# Patient Record
Sex: Male | Born: 1965
Health system: Southern US, Community
[De-identification: ages and names within clinical notes are randomized; demographics above are authoritative.]

## PROBLEM LIST (undated history)

## (undated) DIAGNOSIS — I10 Essential (primary) hypertension: Secondary | ICD-10-CM

## (undated) DIAGNOSIS — I4892 Unspecified atrial flutter: Secondary | ICD-10-CM

## (undated) DIAGNOSIS — Z789 Other specified health status: Secondary | ICD-10-CM

## (undated) DIAGNOSIS — I509 Heart failure, unspecified: Secondary | ICD-10-CM

## (undated) DIAGNOSIS — K2900 Acute gastritis without bleeding: Secondary | ICD-10-CM

## (undated) DIAGNOSIS — Z87828 Personal history of other (healed) physical injury and trauma: Secondary | ICD-10-CM

## (undated) HISTORY — PX: OTHER SURGICAL HISTORY: SHX169

## (undated) HISTORY — DX: Heart failure, unspecified: I50.9

---
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
## 2009-08-31 ENCOUNTER — Emergency Department (HOSPITAL_COMMUNITY): Admission: AC | Admit: 2009-08-31 | Discharge: 2009-09-01 | Payer: Self-pay

## 2010-09-25 LAB — TYPE AND SCREEN

## 2010-09-25 LAB — COMPREHENSIVE METABOLIC PANEL
AST: 28 U/L (ref 0–37)
Albumin: 4 g/dL (ref 3.5–5.2)
CO2: 24 mEq/L (ref 19–32)
Calcium: 9.2 mg/dL (ref 8.4–10.5)
Glucose, Bld: 104 mg/dL — ABNORMAL HIGH (ref 70–99)
Sodium: 140 mEq/L (ref 135–145)
Total Bilirubin: 0.5 mg/dL (ref 0.3–1.2)
Total Protein: 7.1 g/dL (ref 6.0–8.3)

## 2010-09-25 LAB — CBC
HCT: 46.4 % (ref 39.0–52.0)
Hemoglobin: 16.3 g/dL (ref 13.0–17.0)

## 2010-09-25 LAB — POCT I-STAT, CHEM 8
BUN: 5 mg/dL — ABNORMAL LOW (ref 6–23)
Calcium, Ion: 1.15 mmol/L (ref 1.12–1.32)
Glucose, Bld: 103 mg/dL — ABNORMAL HIGH (ref 70–99)
Hemoglobin: 16.3 g/dL (ref 13.0–17.0)
TCO2: 24 mmol/L (ref 0–100)

## 2010-09-25 LAB — PROTIME-INR
INR: 0.9 (ref 0.00–1.49)
Prothrombin Time: 12.1 seconds (ref 11.6–15.2)

## 2012-09-28 ENCOUNTER — Emergency Department (HOSPITAL_COMMUNITY)
Admission: EM | Admit: 2012-09-28 | Discharge: 2012-09-28 | Disposition: A | Discharge status: Home / Self Care | Payer: BC Managed Care – PPO | Attending: Emergency Medicine | Admitting: Emergency Medicine

## 2012-09-28 ENCOUNTER — Encounter (HOSPITAL_COMMUNITY): Payer: Self-pay | Admitting: *Deleted

## 2012-09-28 ENCOUNTER — Emergency Department (HOSPITAL_COMMUNITY): Payer: BC Managed Care – PPO

## 2012-09-28 DIAGNOSIS — F17200 Nicotine dependence, unspecified, uncomplicated: Secondary | ICD-10-CM | POA: Insufficient documentation

## 2012-09-28 DIAGNOSIS — R10819 Abdominal tenderness, unspecified site: Secondary | ICD-10-CM | POA: Insufficient documentation

## 2012-09-28 DIAGNOSIS — K2900 Acute gastritis without bleeding: Secondary | ICD-10-CM

## 2012-09-28 DIAGNOSIS — R110 Nausea: Secondary | ICD-10-CM | POA: Insufficient documentation

## 2012-09-28 LAB — COMPREHENSIVE METABOLIC PANEL
ALT: 26 U/L (ref 0–53)
AST: 20 U/L (ref 0–37)
Calcium: 10.3 mg/dL (ref 8.4–10.5)
Chloride: 93 mEq/L — ABNORMAL LOW (ref 96–112)
Creatinine, Ser: 0.79 mg/dL (ref 0.50–1.35)
GFR calc Af Amer: >90 mL/min (ref >90)
Potassium: 3.6 mEq/L (ref 3.5–5.1)
Sodium: 131 mEq/L — ABNORMAL LOW (ref 135–145)
Total Bilirubin: 1 mg/dL (ref 0.3–1.2)

## 2012-09-28 LAB — URINALYSIS, ROUTINE W REFLEX MICROSCOPIC
Glucose, UA: NEGATIVE mg/dL
Ketones, ur: >80 mg/dL — AB
Nitrite: NEGATIVE
Protein, ur: NEGATIVE mg/dL
Specific Gravity, Urine: 1.02 (ref 1.005–1.030)
pH: 6 (ref 5.0–8.0)

## 2012-09-28 LAB — CBC WITH DIFFERENTIAL/PLATELET
Basophils Absolute: 0 10*3/uL (ref 0.0–0.1)
Basophils Relative: 0 % (ref 0–1)
Eosinophils Relative: 0 % (ref 0–5)
Hemoglobin: 17.9 g/dL — ABNORMAL HIGH (ref 13.0–17.0)
Lymphocytes Relative: 17 % (ref 12–46)
Lymphs Abs: 1.6 10*3/uL (ref 0.7–4.0)
MCH: 34.8 pg — ABNORMAL HIGH (ref 26.0–34.0)
MCHC: 37.3 g/dL — ABNORMAL HIGH (ref 30.0–36.0)
Monocytes Relative: 8 % (ref 3–12)
Neutrophils Relative %: 75 % (ref 43–77)
RBC: 5.15 MIL/uL (ref 4.22–5.81)

## 2012-09-28 LAB — LIPASE, BLOOD: Lipase: 67 U/L — ABNORMAL HIGH (ref 11–59)

## 2012-09-28 MED ORDER — PROMETHAZINE HCL 25 MG/ML IJ SOLN
12.5000 mg | Freq: Once | INTRAMUSCULAR | Status: AC
  Administered 2012-09-28: 12.5 mg via INTRAVENOUS
  Filled 2012-09-28: qty 1

## 2012-09-28 MED ORDER — ONDANSETRON HCL 4 MG/2ML IJ SOLN
4.0000 mg | Freq: Once | INTRAMUSCULAR | Status: AC
  Administered 2012-09-28: 4 mg via INTRAVENOUS
  Filled 2012-09-28: qty 2

## 2012-09-28 MED ORDER — ONDANSETRON HCL 8 MG PO TABS: 8.0000 mg | ORAL_TABLET | Freq: Three times a day (TID) | ORAL | Status: DC | PRN

## 2012-09-28 MED ORDER — IOHEXOL 300 MG/ML  SOLN
100.0000 mL | Freq: Once | INTRAMUSCULAR | Status: AC | PRN
  Administered 2012-09-28: 100 mL via INTRAVENOUS

## 2012-09-28 MED ORDER — HYDROCODONE-ACETAMINOPHEN 5-325 MG PO TABS: 1.0000 | ORAL_TABLET | ORAL | Status: DC | PRN

## 2012-09-28 MED ORDER — MORPHINE SULFATE 4 MG/ML IJ SOLN
4.0000 mg | Freq: Once | INTRAMUSCULAR | Status: AC
  Administered 2012-09-28: 4 mg via INTRAVENOUS
  Filled 2012-09-28: qty 1

## 2012-09-28 MED ORDER — SODIUM CHLORIDE 0.9 % IV BOLUS (SEPSIS)
1000.0000 mL | Freq: Once | INTRAVENOUS | Status: AC
  Administered 2012-09-28: 1000 mL via INTRAVENOUS

## 2012-09-28 MED ORDER — HYDROMORPHONE HCL PF 1 MG/ML IJ SOLN
1.0000 mg | Freq: Once | INTRAMUSCULAR | Status: AC
  Administered 2012-09-28: 1 mg via INTRAVENOUS
  Filled 2012-09-28: qty 1

## 2012-09-28 MED ORDER — IOHEXOL 300 MG/ML  SOLN
50.0000 mL | Freq: Once | INTRAMUSCULAR | Status: AC | PRN
  Administered 2012-09-28: 50 mL via ORAL

## 2012-09-28 NOTE — ED Notes (Signed)
Abdominal pain onset last night  

## 2012-09-28 NOTE — ED Notes (Signed)
Pt was given saltine crackers and ginger ale and tolerated both well.

## 2012-09-30 ENCOUNTER — Emergency Department (HOSPITAL_COMMUNITY): Payer: BC Managed Care – PPO

## 2012-09-30 ENCOUNTER — Emergency Department (HOSPITAL_COMMUNITY)
Admission: EM | Admit: 2012-09-30 | Discharge: 2012-09-30 | Disposition: A | Discharge status: Home / Self Care | Payer: BC Managed Care – PPO | Attending: Emergency Medicine | Admitting: Emergency Medicine

## 2012-09-30 ENCOUNTER — Encounter (HOSPITAL_COMMUNITY): Payer: Self-pay | Admitting: Emergency Medicine

## 2012-09-30 DIAGNOSIS — F17200 Nicotine dependence, unspecified, uncomplicated: Secondary | ICD-10-CM | POA: Insufficient documentation

## 2012-09-30 DIAGNOSIS — R112 Nausea with vomiting, unspecified: Secondary | ICD-10-CM | POA: Insufficient documentation

## 2012-09-30 DIAGNOSIS — R109 Unspecified abdominal pain: Secondary | ICD-10-CM

## 2012-09-30 DIAGNOSIS — R1011 Right upper quadrant pain: Secondary | ICD-10-CM | POA: Insufficient documentation

## 2012-09-30 LAB — CBC WITH DIFFERENTIAL/PLATELET
Basophils Absolute: 0 10*3/uL (ref 0.0–0.1)
Basophils Relative: 0 % (ref 0–1)
Eosinophils Absolute: 0 10*3/uL (ref 0.0–0.7)
Hemoglobin: 17.3 g/dL — ABNORMAL HIGH (ref 13.0–17.0)
MCH: 35.4 pg — ABNORMAL HIGH (ref 26.0–34.0)
MCHC: 37.6 g/dL — ABNORMAL HIGH (ref 30.0–36.0)
Neutro Abs: 7.6 10*3/uL (ref 1.7–7.7)
Neutrophils Relative %: 74 % (ref 43–77)
Platelets: 211 10*3/uL (ref 150–400)
RDW: 12.6 % (ref 11.5–15.5)

## 2012-09-30 LAB — COMPREHENSIVE METABOLIC PANEL
ALT: 17 U/L (ref 0–53)
AST: 14 U/L (ref 0–37)
Albumin: 4.1 g/dL (ref 3.5–5.2)
Alkaline Phosphatase: 80 U/L (ref 39–117)
BUN: 7 mg/dL (ref 6–23)
Chloride: 96 mEq/L (ref 96–112)
Potassium: 3.4 mEq/L — ABNORMAL LOW (ref 3.5–5.1)
Sodium: 134 mEq/L — ABNORMAL LOW (ref 135–145)
Total Bilirubin: 0.6 mg/dL (ref 0.3–1.2)
Total Protein: 7.5 g/dL (ref 6.0–8.3)

## 2012-09-30 LAB — URINALYSIS, ROUTINE W REFLEX MICROSCOPIC
Bilirubin Urine: NEGATIVE
Ketones, ur: >80 mg/dL — AB
Leukocytes, UA: NEGATIVE
Nitrite: NEGATIVE
Protein, ur: NEGATIVE mg/dL
Urobilinogen, UA: 0.2 mg/dL (ref 0.0–1.0)
pH: 7 (ref 5.0–8.0)

## 2012-09-30 LAB — LIPASE, BLOOD: Lipase: 74 U/L — ABNORMAL HIGH (ref 11–59)

## 2012-09-30 LAB — ETHANOL: Alcohol, Ethyl (B): <11 mg/dL (ref 0–11)

## 2012-09-30 MED ORDER — MORPHINE SULFATE 4 MG/ML IJ SOLN
4.0000 mg | INTRAMUSCULAR | Status: DC | PRN
  Administered 2012-09-30: 4 mg via INTRAVENOUS
  Filled 2012-09-30: qty 1

## 2012-09-30 MED ORDER — ONDANSETRON HCL 4 MG/2ML IJ SOLN
4.0000 mg | INTRAMUSCULAR | Status: DC | PRN
  Administered 2012-09-30: 4 mg via INTRAVENOUS
  Filled 2012-09-30: qty 2

## 2012-09-30 MED ORDER — SODIUM CHLORIDE 0.9 % IV SOLN
INTRAVENOUS | Status: DC
  Administered 2012-09-30: 12:00:00 via INTRAVENOUS

## 2012-09-30 MED ORDER — POTASSIUM CHLORIDE 20 MEQ/15ML (10%) PO LIQD
40.0000 meq | Freq: Once | ORAL | Status: AC
  Administered 2012-09-30: 40 meq via ORAL
  Filled 2012-09-30: qty 30

## 2012-09-30 MED ORDER — TRAMADOL HCL 50 MG PO TABS: 50.0000 mg | ORAL_TABLET | Freq: Four times a day (QID) | ORAL | Status: DC | PRN

## 2012-09-30 NOTE — ED Provider Notes (Signed)
History     CSN: [id number]  Arrival date & time 09/30/12  7829   First MD Initiated Contact with Patient 09/30/12 1023      Chief Complaint  Patient presents with  . Abdominal Pain     HPI Pt was seen at 1030.   Per pt, c/o gradual onset and persistence of constant right upper abd "pain" for the past 3 days.  Has been associated with multiple intermittent episodes of N/V.  States the N/V has improved, but the abd pain continues.  Pt states he was evaluated in the ED 2 days ago for same and was told his "blood work and CT were fine."  Denies diarrhea, no fevers, no back pain, no rash, no CP/SOB, no cough, no injury, no black or blood in stools or emesis, no dysuria/hematuria, no testicular pain/swelling.       History reviewed. No pertinent past medical history.  History reviewed. No pertinent past surgical history.    History  Substance Use Topics  . Smoking status: Current Every Day Smoker -- 0.50 packs/day  . Smokeless tobacco: Not on file  . Alcohol Use: 1.2 oz/week    2 Cans of beer per week     Comment: drinks a couple beers every other day.      Review of Systems ROS: Statement: All systems negative except as marked or noted in the HPI; Constitutional: Negative for fever and chills. ; ; Eyes: Negative for eye pain, redness and discharge. ; ; ENMT: Negative for ear pain, hoarseness, nasal congestion, sinus pressure and sore throat. ; ; Cardiovascular: Negative for chest pain, palpitations, diaphoresis, dyspnea and peripheral edema. ; ; Respiratory: Negative for cough, wheezing and stridor. ; ; Gastrointestinal: +abd pain, N/V. Negative for diarrhea, blood in stool, hematemesis, jaundice and rectal bleeding. . ; ; Genitourinary: Negative for dysuria, flank pain and hematuria. ; ; Genital:  No penile drainage or rash, no testicular pain or swelling, no scrotal rash or swelling.;;  Musculoskeletal: Negative for back pain and neck pain. Negative for swelling and trauma.; ;  Skin: Negative for pruritus, rash, abrasions, blisters, bruising and skin lesion.; ; Neuro: Negative for headache, lightheadedness and neck stiffness. Negative for weakness, altered level of consciousness , altered mental status, extremity weakness, paresthesias, involuntary movement, seizure and syncope.       Allergies  Review of patient's allergies indicates no known allergies.  Home Medications   Current Outpatient Rx  Name  Route  Sig  Dispense  Refill  . HYDROcodone-acetaminophen (NORCO/VICODIN) 5-325 MG per tablet   Oral   Take 1 tablet by mouth every 4 (four) hours as needed for pain.   15 tablet   0   . ondansetron (ZOFRAN) 8 MG tablet   Oral   Take 1 tablet (8 mg total) by mouth every 8 (eight) hours as needed for nausea.   12 tablet   0     BP 157/113  Pulse 91  Temp(Src) 98 F (36.7 C)  Resp 20  Ht 6' (1.829 m)  Wt 185 lb (83.915 kg)  BMI 25.08 kg/m2  SpO2 100%  Physical Exam 1035: Physical examination:  Nursing notes reviewed; Vital signs and O2 SAT reviewed;  Constitutional: Well developed, Well nourished, Well hydrated, In no acute distress; Head:  Normocephalic, atraumatic; Eyes: EOMI, PERRL, No scleral icterus; ENMT: Mouth and pharynx normal, Mucous membranes moist; Neck: Supple, Full range of motion, No lymphadenopathy; Cardiovascular: Regular rate and rhythm, No gallop; Respiratory: Breath sounds clear & equal  bilaterally, No rales, rhonchi, wheezes.  Speaking full sentences with ease, Normal respiratory effort/excursion; Chest: Nontender, Movement normal; Abdomen: Soft, +RUQ, mid-epigastric areas tender to palp. No rebound or guarding. Nondistended, Normal bowel sounds;; Extremities: Pulses normal, No tenderness, No edema, No calf edema or asymmetry.; Neuro: AA&Ox3, Major CN grossly intact.  Speech clear. No gross focal motor or sensory deficits in extremities.; Skin: Color normal, Warm, Dry.   ED Course  Procedures    MDM  MDM Reviewed: previous  chart, nursing note and vitals Reviewed previous: CT scan and labs Interpretation: labs, x-ray and ultrasound   Results for orders placed during the hospital encounter of 09/30/12  URINALYSIS, ROUTINE W REFLEX MICROSCOPIC      Result Value Range   Color, Urine YELLOW  YELLOW   APPearance CLEAR  CLEAR   Specific Gravity, Urine <1.005 (*) 1.005 - 1.030   pH 7.0  5.0 - 8.0   Glucose, UA NEGATIVE  NEGATIVE mg/dL   Hgb urine dipstick NEGATIVE  NEGATIVE   Bilirubin Urine NEGATIVE  NEGATIVE   Ketones, ur >80 (*) NEGATIVE mg/dL   Protein, ur NEGATIVE  NEGATIVE mg/dL   Urobilinogen, UA 0.2  0.0 - 1.0 mg/dL   Nitrite NEGATIVE  NEGATIVE   Leukocytes, UA NEGATIVE  NEGATIVE  CBC WITH DIFFERENTIAL      Result Value Range   WBC 10.3  4.0 - 10.5 K/uL   RBC 4.89  4.22 - 5.81 MIL/uL   Hemoglobin 17.3 (*) 13.0 - 17.0 g/dL   HCT 16.1  09.6 - 04.5 %   MCV 94.1  78.0 - 100.0 fL   MCH 35.4 (*) 26.0 - 34.0 pg   MCHC 37.6 (*) 30.0 - 36.0 g/dL   RDW 40.9  81.1 - 91.4 %   Platelets 211  150 - 400 K/uL   Neutrophils Relative 74  43 - 77 %   Neutro Abs 7.6  1.7 - 7.7 K/uL   Lymphocytes Relative 18  12 - 46 %   Lymphs Abs 1.8  0.7 - 4.0 K/uL   Monocytes Relative 8  3 - 12 %   Monocytes Absolute 0.8  0.1 - 1.0 K/uL   Eosinophils Relative 0  0 - 5 %   Eosinophils Absolute 0.0  0.0 - 0.7 K/uL   Basophils Relative 0  0 - 1 %   Basophils Absolute 0.0  0.0 - 0.1 K/uL  COMPREHENSIVE METABOLIC PANEL      Result Value Range   Sodium 134 (*) 135 - 145 mEq/L   Potassium 3.4 (*) 3.5 - 5.1 mEq/L   Chloride 96  96 - 112 mEq/L   CO2 23  19 - 32 mEq/L   Glucose, Bld 90  70 - 99 mg/dL   BUN 7  6 - 23 mg/dL   Creatinine, Ser 7.82  0.50 - 1.35 mg/dL   Calcium 9.9  8.4 - 95.6 mg/dL   Total Protein 7.5  6.0 - 8.3 g/dL   Albumin 4.1  3.5 - 5.2 g/dL   AST 14  0 - 37 U/L   ALT 17  0 - 53 U/L   Alkaline Phosphatase 80  39 - 117 U/L   Total Bilirubin 0.6  0.3 - 1.2 mg/dL   GFR calc non Af Amer >90  >90 mL/min    GFR calc Af Amer >90  >90 mL/min  LIPASE, BLOOD      Result Value Range   Lipase 74 (*) 11 - 59 U/L  ETHANOL  Result Value Range   Alcohol, Ethyl (B) <11  0 - 11 mg/dL   Ct Abdomen Pelvis W Contrast 09/28/2012  *RADIOLOGY REPORT*  Clinical Data: Right lower quadrant abdominal pain with nausea and vomiting.  CT ABDOMEN AND PELVIS WITH CONTRAST  Technique:  Multidetector CT imaging of the abdomen and pelvis was performed following the standard protocol during bolus administration of intravenous contrast.  Contrast: 50mL OMNIPAQUE IOHEXOL 300 MG/ML  SOLN, OMNIPAQUE IOHEXOL 300 MG/ML  SOLN  Comparison: None.  Findings: The appendix is well visualized and normal in appearance. There is no evidence of acute appendicitis.  The liver, gallbladder, pancreas, spleen, adrenal glands and kidneys are unremarkable.  Bowel shows no evidence of inflammation or obstruction.  No masses, enlarged lymph nodes are hernias are identified.  Bladder is unremarkable.  Mild degenerative changes are present in the lumbar spine.  IMPRESSION: No acute findings.  No evidence of appendicitis.   Original Report Authenticated By: Irish Lack, M.D.    Dg Abd Acute W/chest 09/30/2012  *RADIOLOGY REPORT*  Clinical Data: Right lower quadrant abdominal pain  ACUTE ABDOMEN SERIES (ABDOMEN 2 VIEW & CHEST 1 VIEW)  Comparison: 09/28/2012  Findings: The heart and pulmonary vascularity are within normal limits.  The lungs are clear bilaterally.  The abdomen demonstrates a nonobstructive bowel gas pattern. Contrast material is noted within the colon consistent with the recent CT examination.  No free air is seen.  IMPRESSION: Nonspecific chest and abdomen.   Original Report Authenticated By: Alcide Clever, M.D.    US Abdomen Limited Ruq 09/30/2012  *RADIOLOGY REPORT*  Clinical Data:  Gallstones  GALLBLADDER ULTRASOUND  Comparison:  None  Findings:  Gallbladder:  No gallstones, gallbladder wall thickening, or pericholecystic fluid.   Negative sonographic Murphy's sign.  Common Bile Duct:  Within normal limits in caliber.  Liver:  Focal echogenic structure near the porta hepatis is identified likely representing focal fatty deposition.  This measures 2.2 cm.  Within normal limits in parenchymal echogenicity.  IMPRESSION:  1.  Normal appearance of the gallbladder.  No sonographic features of acute cholecystitis. 2.  Echogenic structure near the porta hepatis is favored to represent fatty deposition.   Original Report Authenticated By: Signa Kell, M.D.    Results for LUCILLE, CRICHLOW (MRN [id number]) as of 09/30/2012 12:45  Ref. Range 09/28/2012 17:01 09/30/2012 10:58  Lipase Latest Range: 11-59 U/L 67 (H) 74 (H)     1305:  Pt has tol PO well while in the ED without N/V.  No stooling while in the ED.  VSS.  Lipase non-specifically elevated today and 2 days ago; otherwise workup today and previous without acute findings.  Will tx symptomatically at this time. Dx and testing d/w pt.  Questions answered.  Verb understanding, agreeable to d/c home with outpt f/u.         Laray Anger, DO 10/02/12 1351

## 2012-09-30 NOTE — ED Notes (Signed)
MD at bedside. 

## 2012-09-30 NOTE — ED Notes (Signed)
Ginger ale provided for PO challenge

## 2012-09-30 NOTE — ED Notes (Signed)
Pt states has had right lower quad pain since Tuesday night. Was seen here Wednesday and was told they couldn't find anyting wrong. Pt states increased pain, unable to eat or sleep from pain.

## 2012-10-02 LAB — URINE CULTURE
Colony Count: NO GROWTH
Culture: NO GROWTH

## 2012-10-02 NOTE — ED Provider Notes (Signed)
History     CSN: [id number]  Arrival date & time 09/28/12  1603   First MD Initiated Contact with Patient 09/28/12 1643      Chief Complaint  Patient presents with  . Abdominal Pain    (Consider location/radiation/quality/duration/timing/severity/associated sxs/prior treatment) HPI Comments: Bryan Pace is a 47 y.o. Male presenting with waxing and waning cramping and sharp periumbilical abdominal and epigastric pain since last night.  His pain is constant and severe enough it prevented him from sleeping last night.  He has had nausea without emesis and denies fevers or chills.  He denies vomiting, and has had decreased appetite.  He has tried no medications for relief of symptoms.  He has found no alleviators of his pain.  Movement such as walking makes worse.  His last bm was yesterday and normal.       Patient is a 47 y.o. male presenting with abdominal pain. The history is provided by the patient.  Abdominal Pain Associated symptoms: nausea   Associated symptoms: no chest pain, no chills, no constipation, no diarrhea, no fever, no shortness of breath, no sore throat and no vomiting     History reviewed. No pertinent past medical history.  History reviewed. No pertinent past surgical history.  History reviewed. No pertinent family history.  History  Substance Use Topics  . Smoking status: Current Every Day Smoker -- 0.50 packs/day  . Smokeless tobacco: Not on file  . Alcohol Use: 1.2 oz/week    2 Cans of beer per week     Comment: drinks a couple beers every other day.      Review of Systems  Constitutional: Negative for fever and chills.  HENT: Negative for congestion, sore throat and neck pain.   Eyes: Negative.   Respiratory: Negative for chest tightness and shortness of breath.   Cardiovascular: Negative for chest pain.  Gastrointestinal: Positive for nausea and abdominal pain. Negative for vomiting, diarrhea and constipation.  Genitourinary: Negative.   Negative for flank pain.  Musculoskeletal: Negative for joint swelling and arthralgias.  Skin: Negative.  Negative for rash and wound.  Neurological: Negative for dizziness, weakness, light-headedness, numbness and headaches.  Psychiatric/Behavioral: Negative.     Allergies  Review of patient's allergies indicates no known allergies.  Home Medications   Current Outpatient Rx  Name  Route  Sig  Dispense  Refill  . HYDROcodone-acetaminophen (NORCO/VICODIN) 5-325 MG per tablet   Oral   Take 1 tablet by mouth every 4 (four) hours as needed for pain.   15 tablet   0   . ondansetron (ZOFRAN) 8 MG tablet   Oral   Take 1 tablet (8 mg total) by mouth every 8 (eight) hours as needed for nausea.   12 tablet   0   . traMADol (ULTRAM) 50 MG tablet   Oral   Take 1 tablet (50 mg total) by mouth every 6 (six) hours as needed for pain.   15 tablet   0     BP 169/95  Pulse 77  Temp(Src) 97.9 F (36.6 C) (Oral)  Resp 18  Ht 6' (1.829 m)  Wt 185 lb (83.915 kg)  BMI 25.08 kg/m2  SpO2 100%  Physical Exam  Nursing note and vitals reviewed. Constitutional: He appears well-developed and well-nourished.  HENT:  Head: Normocephalic and atraumatic.  Eyes: Conjunctivae are normal.  Neck: Normal range of motion.  Cardiovascular: Normal rate, regular rhythm, normal heart sounds and intact distal pulses.   Pulmonary/Chest: Effort normal and breath  sounds normal. He has no wheezes.  Abdominal: Soft. Bowel sounds are normal. He exhibits no mass. There is no hepatosplenomegaly. There is tenderness in the right lower quadrant, epigastric area and periumbilical area. There is no rebound, no guarding, no CVA tenderness, no tenderness at McBurney's point and negative Murphy's sign.  Musculoskeletal: Normal range of motion.  Neurological: He is alert.  Skin: Skin is warm and dry.  Psychiatric: He has a normal mood and affect.    ED Course  Procedures (including critical care time)  Labs  Reviewed  CBC WITH DIFFERENTIAL - Abnormal; Notable for the following:    Hemoglobin 17.9 (*)    MCH 34.8 (*)    MCHC 37.3 (*)    All other components within normal limits  COMPREHENSIVE METABOLIC PANEL - Abnormal; Notable for the following:    Sodium 131 (*)    Chloride 93 (*)    Glucose, Bld 107 (*)    All other components within normal limits  LIPASE, BLOOD - Abnormal; Notable for the following:    Lipase 67 (*)    All other components within normal limits  URINALYSIS, ROUTINE W REFLEX MICROSCOPIC - Abnormal; Notable for the following:    Bilirubin Urine SMALL (*)    Ketones, ur >80 (*)    All other components within normal limits   No results found.   1. Gastritis, acute     Medications  sodium chloride 0.9 % bolus 1,000 mL (0 mLs Intravenous Stopped 09/28/12 1757)  ondansetron (ZOFRAN) injection 4 mg (4 mg Intravenous Given 09/28/12 1718)  morphine 4 MG/ML injection 4 mg (4 mg Intravenous Given 09/28/12 1719)  promethazine (PHENERGAN) injection 12.5 mg (12.5 mg Intravenous Given 09/28/12 1757)  HYDROmorphone (DILAUDID) injection 1 mg (1 mg Intravenous Given 09/28/12 1836)  iohexol (OMNIPAQUE) 300 MG/ML solution 50 mL (50 mLs Oral Contrast Given 09/28/12 1841)  iohexol (OMNIPAQUE) 300 MG/ML solution 100 mL (100 mLs Intravenous Contrast Given 09/28/12 1917)   Pt did not received relief of pain from morphine,  But did from dilaudid.  MDM  Patients labs and/or radiological studies were viewed and considered during the medical decision making and disposition process.   Pt was also seen by Dr Adriana Simas.  Pt with leukocytosis, mild hyponatremia, ketonuria, with fluid replacement per IV, also tolerated PO fluids prior to dc home.  Ct scan negative.  Lipase borderline elevated, not significantly so.  Pt was prescribed short course of hydrocodone and zofran for home use.  Encouraged recheck if not improved over the next 24 hours,  But with negative Ct scan,  Reassuring.  Encouraged recheck  here for any worsened sx.  Referrals given for establishing pcp. The patient appears reasonably screened and/or stabilized for discharge and I doubt any other medical condition or other [facility] requiring further screening, evaluation, or treatment in the ED at this time prior to discharge.         Burgess Amor, PA-C 10/02/12 2151

## 2012-10-05 ENCOUNTER — Other Ambulatory Visit: Payer: Self-pay | Admitting: Internal Medicine

## 2012-10-05 ENCOUNTER — Encounter: Payer: Self-pay | Admitting: Gastroenterology

## 2012-10-05 ENCOUNTER — Ambulatory Visit (INDEPENDENT_AMBULATORY_CARE_PROVIDER_SITE_OTHER): Payer: BC Managed Care – PPO | Admitting: Gastroenterology

## 2012-10-05 VITALS — BP 173/113 | HR 101 | Ht 72.0 in | Wt 171.8 lb

## 2012-10-05 DIAGNOSIS — R1013 Epigastric pain: Secondary | ICD-10-CM

## 2012-10-05 DIAGNOSIS — R109 Unspecified abdominal pain: Secondary | ICD-10-CM | POA: Insufficient documentation

## 2012-10-05 DIAGNOSIS — G8929 Other chronic pain: Secondary | ICD-10-CM

## 2012-10-05 NOTE — Assessment & Plan Note (Signed)
47 year old male with acute abdominal pain located primarily in the RUQ but also with diffuse abdominal pain, worsened with eating, no N/V. No melena or rectal bleeding. Prior use of NSAIDs multiple times a day before onset a week ago. No fever, chills. ED visit X 2 with negative CT, [place], and AAS. Lipase only marginally elevated in the low 70s, LFTs normal, WBC normal. He states no BM for about a week, but his intake has been extremely poor. +flatus. On exam, he is significantly tender entire upper abdominal region. NO peritoneal signs. Thus far, abdominal imaging has been unrevealing. Labs drawn through Dr. Margo Aye today but unavailable at time of visit. Needs EGD to evaluate for NSAID-induced gastritis, PUD, H.pylori. If unrevealing, proceed with HIDA.   Proceed with upper endoscopy in the near future with Dr. [name]. The risks, benefits, and alternatives have been discussed in detail with patient. They have stated understanding and desire to proceed.  Start Nexium BID (prescribed by Dr. Margo Aye) Start Carafate as prescribed by Dr. Margo Aye

## 2012-10-05 NOTE — Progress Notes (Signed)
 Referring Provider: Dr. Hall Primary Care Physician:  HALL,ZACK, MD Primary Gastroenterologist:  Dr. Rourk  Chief Complaint  Patient presents with  . Abdominal Pain    HPI:   Bryan Pace is a very pleasant 47-year-old male presenting today as an urgent referral at the request of Dr. Zack Hall. He notes acute onset of RUQ pain as well as diffuse abdominal pain since last week. Constant, no N/V. Eating worsens pain. Unable to even tolerate jello or chicken broth. No prior episodes. Wt loss of about 14 lbs in the past week. Poor po intake, no BM since last Monday, +flatus, denies feeling constipated. No reflux, dysphagia, rectal bleeding. Prior to this onset was taking Ibuprofen multiple times per day. No melena noted.   He has been evaluated in the ED twice, 3/26 and 3/28.  AAS: non-specific US: WNL CBD, normal gallbladder CT: no acute findings.   Lipase only mildly elevated as high as 74. LFTs normal on 2 separate occasions. Normal white count.   PMH: NONE  Past Surgical History  Procedure Laterality Date  . None      Current Outpatient Prescriptions  Medication Sig Dispense Refill  . ondansetron (ZOFRAN) 8 MG tablet Take 1 tablet (8 mg total) by mouth every 8 (eight) hours as needed for nausea.  12 tablet  0  . traMADol (ULTRAM) 50 MG tablet Take 1 tablet (50 mg total) by mouth every 6 (six) hours as needed for pain.  15 tablet  0   No current facility-administered medications for this visit.    Allergies as of 10/05/2012  . (No Known Allergies)    Family History  Problem Relation Age of Onset  . Colon cancer Neg Hx     History   Social History  . Marital Status: Single    Spouse Name: N/A    Number of Children: N/A  . Years of Education: N/A   Occupational History  . Not on file.   Social History Main Topics  . Smoking status: Current Every Day Smoker -- 0.50 packs/day  . Smokeless tobacco: Not on file  . Alcohol Use: 1.2 oz/week    2 Cans of beer per  week     Comment: drinks a couple beers every other day.  . Drug Use: No  . Sexually Active: Not on file   Other Topics Concern  . Not on file   Social History Narrative  . No narrative on file    Review of Systems: Gen: Denies any fever, chills, loss of appetite, fatigue, weight loss. CV: Denies chest pain, heart palpitations, syncope, peripheral edema. Resp: Denies shortness of breath with rest, cough, wheezing GI: SEE HPI GU : Denies urinary burning, urinary frequency, urinary incontinence.  MS: Denies joint pain, muscle weakness, cramps, limited movement Derm: Denies rash, itching, dry skin Psych: Denies depression, anxiety, confusion or memory loss  Heme: Denies bruising, bleeding, and enlarged lymph nodes.  Physical Exam: BP 173/113  Pulse 101  Ht 6' (1.829 m)  Wt 171 lb 12.8 oz (77.928 kg)  BMI 23.3 kg/m2 General:   Alert and oriented. Well-developed, well-nourished, pleasant and cooperative. Appears in obvious discomfort.  Head:  Normocephalic and atraumatic. Eyes:  Conjunctiva pink, sclera clear, no icterus.   Conjunctiva pink. Ears:  Normal auditory acuity. Nose:  No deformity, discharge,  or lesions. Mouth:  No deformity or lesions, mucosa pink and moist.  Neck:  Supple, without mass or thyromegaly. Lungs:  Clear to auscultation bilaterally, without wheezing, rales, or   rhonchi.  Heart:  S1, S2 present without murmurs noted.  Abdomen:  +BS, soft, TTP LUQ, EPIGASTRIC REGION, RUQ and non-distended. Without mass or HSM. NO PERITONEAL SIGNS Rectal:  Deferred  Msk:  Symmetrical without gross deformities. Normal posture. Extremities:  Without clubbing or edema. Neurologic:  Alert and  oriented x4;  grossly normal neurologically. Skin:  Intact, warm and dry without significant lesions or rashes Cervical Nodes:  No significant cervical adenopathy. Psych:  Alert and cooperative. Normal mood and affect.   

## 2012-10-05 NOTE — Patient Instructions (Addendum)
Take Nexium twice a day, take Carafate as instructed.  We have set you up for an upper endoscopy.  If your pain worsens, you have black and tarry stool, or you are unable to keep liquids down, go to the emergency room immediately.

## 2012-10-06 ENCOUNTER — Encounter (HOSPITAL_COMMUNITY)
Admission: RE | Disposition: A | Discharge status: Home / Self Care | Payer: Self-pay | Source: Ambulatory Visit | Attending: Internal Medicine

## 2012-10-06 ENCOUNTER — Ambulatory Visit (HOSPITAL_COMMUNITY)
Admission: RE | Admit: 2012-10-06 | Discharge: 2012-10-06 | Disposition: A | Discharge status: Home / Self Care | Payer: BC Managed Care – PPO | Source: Ambulatory Visit | Attending: Internal Medicine | Admitting: Internal Medicine

## 2012-10-06 ENCOUNTER — Encounter (HOSPITAL_COMMUNITY): Payer: Self-pay | Admitting: *Deleted

## 2012-10-06 DIAGNOSIS — K2980 Duodenitis without bleeding: Secondary | ICD-10-CM

## 2012-10-06 DIAGNOSIS — G8929 Other chronic pain: Secondary | ICD-10-CM

## 2012-10-06 DIAGNOSIS — F17200 Nicotine dependence, unspecified, uncomplicated: Secondary | ICD-10-CM | POA: Insufficient documentation

## 2012-10-06 DIAGNOSIS — R1013 Epigastric pain: Secondary | ICD-10-CM

## 2012-10-06 DIAGNOSIS — Z79899 Other long term (current) drug therapy: Secondary | ICD-10-CM | POA: Insufficient documentation

## 2012-10-06 DIAGNOSIS — Z791 Long term (current) use of non-steroidal anti-inflammatories (NSAID): Secondary | ICD-10-CM | POA: Insufficient documentation

## 2012-10-06 DIAGNOSIS — K319 Disease of stomach and duodenum, unspecified: Secondary | ICD-10-CM | POA: Insufficient documentation

## 2012-10-06 DIAGNOSIS — R634 Abnormal weight loss: Secondary | ICD-10-CM | POA: Insufficient documentation

## 2012-10-06 DIAGNOSIS — R933 Abnormal findings on diagnostic imaging of other parts of digestive tract: Secondary | ICD-10-CM

## 2012-10-06 HISTORY — DX: Personal history of other (healed) physical injury and trauma: Z87.828

## 2012-10-06 HISTORY — DX: Other specified health status: Z78.9

## 2012-10-06 HISTORY — PX: ESOPHAGOGASTRODUODENOSCOPY: SHX5428

## 2012-10-06 SURGERY — EGD (ESOPHAGOGASTRODUODENOSCOPY)
Anesthesia: Moderate Sedation

## 2012-10-06 MED ORDER — MEPERIDINE HCL 100 MG/ML IJ SOLN
INTRAMUSCULAR | Status: AC
  Filled 2012-10-06: qty 2

## 2012-10-06 MED ORDER — MIDAZOLAM HCL 5 MG/5ML IJ SOLN
INTRAMUSCULAR | Status: AC
  Filled 2012-10-06: qty 10

## 2012-10-06 MED ORDER — SODIUM CHLORIDE 0.9 % IV SOLN
INTRAVENOUS | Status: DC
  Administered 2012-10-06: 14:00:00 via INTRAVENOUS

## 2012-10-06 MED ORDER — MIDAZOLAM HCL 5 MG/5ML IJ SOLN
INTRAMUSCULAR | Status: DC | PRN
  Administered 2012-10-06 (×2): 1 mg via INTRAVENOUS
  Administered 2012-10-06: 2 mg via INTRAVENOUS

## 2012-10-06 MED ORDER — SIMETHICONE 40 MG/0.6ML PO SUSP
ORAL | Status: DC | PRN
  Administered 2012-10-06: 15:00:00

## 2012-10-06 MED ORDER — MEPERIDINE HCL 100 MG/ML IJ SOLN
INTRAMUSCULAR | Status: DC | PRN
  Administered 2012-10-06 (×2): 25 mg via INTRAVENOUS
  Administered 2012-10-06: 50 mg via INTRAVENOUS

## 2012-10-06 MED ORDER — ONDANSETRON HCL 4 MG/2ML IJ SOLN
INTRAMUSCULAR | Status: DC | PRN
  Administered 2012-10-06: 4 mg via INTRAVENOUS

## 2012-10-06 MED ORDER — ONDANSETRON HCL 4 MG/2ML IJ SOLN
INTRAMUSCULAR | Status: AC
  Filled 2012-10-06: qty 2

## 2012-10-06 NOTE — H&P (View-Only) (Signed)
Referring Provider: Dr. Margo Aye Primary Care Physician:  Bryan Melena, MD Primary Gastroenterologist:  Dr. [name]  Chief Complaint  Patient presents with  . Abdominal Pain    HPI:   Bryan Pace is a very pleasant 47 year old male presenting today as an urgent referral at the request of Dr. Dwana Pace. He notes acute onset of RUQ pain as well as diffuse abdominal pain since last week. Constant, no N/V. Eating worsens pain. Unable to even tolerate jello or chicken broth. No prior episodes. Wt loss of about 14 lbs in the past week. Poor po intake, no BM since last Monday, +flatus, denies feeling constipated. No reflux, dysphagia, rectal bleeding. Prior to this onset was taking Ibuprofen multiple times per day. No Pace noted.   He has been evaluated in the ED twice, 3/26 and 3/28.  AAS: non-specific [place]: WNL CBD, normal gallbladder CT: no acute findings.   Lipase only mildly elevated as high as 74. LFTs normal on 2 separate occasions. Normal white count.   PMH: NONE  Past Surgical History  Procedure Laterality Date  . None      Current Outpatient Prescriptions  Medication Sig Dispense Refill  . ondansetron (ZOFRAN) 8 MG tablet Take 1 tablet (8 mg total) by mouth every 8 (eight) hours as needed for nausea.  12 tablet  0  . traMADol (ULTRAM) 50 MG tablet Take 1 tablet (50 mg total) by mouth every 6 (six) hours as needed for pain.  15 tablet  0   No current facility-administered medications for this visit.    Allergies as of 10/05/2012  . (No Known Allergies)    Family History  Problem Relation Age of Onset  . Colon cancer Neg Hx     History   Social History  . Marital Status: Single    Spouse Name: N/A    Number of Children: N/A  . Years of Education: N/A   Occupational History  . Not on file.   Social History Main Topics  . Smoking status: Current Every Day Smoker -- 0.50 packs/day  . Smokeless tobacco: Not on file  . Alcohol Use: 1.2 oz/week    2 Cans of beer per  week     Comment: drinks a couple beers every other day.  . Drug Use: No  . Sexually Active: Not on file   Other Topics Concern  . Not on file   Social History Narrative  . No narrative on file    Review of Systems: Gen: Denies any fever, chills, loss of appetite, fatigue, weight loss. CV: Denies chest pain, heart palpitations, syncope, peripheral edema. Resp: Denies shortness of breath with rest, cough, wheezing GI: SEE HPI GU : Denies urinary burning, urinary frequency, urinary incontinence.  MS: Denies joint pain, muscle weakness, cramps, limited movement Derm: Denies rash, itching, dry skin Psych: Denies depression, anxiety, confusion or memory loss  Heme: Denies bruising, bleeding, and enlarged lymph nodes.  Physical Exam: BP 173/113  Pulse 101  Ht 6' (1.829 m)  Wt 171 lb 12.8 oz (77.928 kg)  BMI 23.3 kg/m2 General:   Alert and oriented. Well-developed, well-nourished, pleasant and cooperative. Appears in obvious discomfort.  Head:  Normocephalic and atraumatic. Eyes:  Conjunctiva pink, sclera clear, no icterus.   Conjunctiva pink. Ears:  Normal auditory acuity. Nose:  No deformity, discharge,  or lesions. Mouth:  No deformity or lesions, mucosa pink and moist.  Neck:  Supple, without mass or thyromegaly. Lungs:  Clear to auscultation bilaterally, without wheezing, rales, or  rhonchi.  Heart:  S1, S2 present without murmurs noted.  Abdomen:  +BS, soft, TTP LUQ, EPIGASTRIC REGION, RUQ and non-distended. Without mass or HSM. NO PERITONEAL SIGNS Rectal:  Deferred  Msk:  Symmetrical without gross deformities. Normal posture. Extremities:  Without clubbing or edema. Neurologic:  Alert and  oriented x4;  grossly normal neurologically. Skin:  Intact, warm and dry without significant lesions or rashes Cervical Nodes:  No significant cervical adenopathy. Psych:  Alert and cooperative. Normal mood and affect.

## 2012-10-06 NOTE — Interval H&P Note (Signed)
History and Physical Interval Note:  10/06/2012 2:51 PM  Bryan Pace  has presented today for surgery, with the diagnosis of EPIGASTRIC PAIN  The various methods of treatment have been discussed with the patient and family. After consideration of risks, benefits and other options for treatment, the patient has consented to  Procedure(s) with comments: ESOPHAGOGASTRODUODENOSCOPY (EGD) (N/A) - 3:15 as a surgical intervention .  The patient's history has been reviewed, patient examined, no change in status, stable for surgery.  I have reviewed the patient's chart and labs.  Questions were answered to the patient's satisfaction.     [name]  GI symptoms have improved dramatically in just a couple days with cessation of NSAIDs along with Carafate and Nexium. No change otherwise

## 2012-10-06 NOTE — Progress Notes (Signed)
CC PCP 

## 2012-10-06 NOTE — Op Note (Signed)
[facility] [address] [place], [address]   ENDOSCOPY PROCEDURE REPORT  PATIENT: Bryan Pace, Bryan Pace  MR#: [id number] BIRTHDATE: 10/13/1965 , 46  yrs. old GENDER: Male ENDOSCOPIST: R.  Roetta Sessions, MD FACP FACG REFERRED BY:  Catalina Pizza, M.D. PROCEDURE DATE:  10/06/2012 PROCEDURE:     EGD with gastric biopsy  INDICATIONS:     Epigastric pain; recent high dose NSAID use. Patient notes considerable improvement in abdominal pain since stopping nonsteroidal use and beginning Nexium and Carafate just yesterday.  INFORMED CONSENT:   The risks, benefits, limitations, alternatives and imponderables have been discussed.  The potential for biopsy, esophogeal dilation, etc. have also been reviewed.  Questions have been answered.  All parties agreeable.  Please see the history and physical in the medical record for more information.  MEDICATIONS:   Versed 4 mg IV and Demerol 75 mg IV in divided doses. Cetacaine spray. Zofran 4 mg IV  DESCRIPTION OF PROCEDURE:   The EG-[id number] ([id number])  endoscope was introduced through the mouth and advanced to the second portion of the duodenum without difficulty or limitations.  The mucosal surfaces were surveyed very carefully during advancement of the scope and upon withdrawal.  Retroflexion view of the proximal stomach and esophagogastric junction was performed.      FINDINGS: Normal esophageal mucosa. Stomach empty. Patchy erythema and superficial antral erosions present. No ulcer or infiltrating process seen. Patent pylorus. Examination of bulb and second portion revealed marked inflammatory changes consistent with nodularity and erosions throughout the bulb without ulcer or tumor seen.  THERAPEUTIC / DIAGNOSTIC MANEUVERS PERFORMED:  Biopsies of the abnormal antrum and gastric body taken for histologic study.   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  Status post biopsy to rule out H. pylori. Erosive duodenitis.  RECOMMENDATIONS: Avoid all nonsteroidal agents. Continue Carafate for another week and may stop. Continue Nexium 40 mg orally twice daily. Followup on biopsies. Office visit with [place] in 6 weeks.    _______________________________ R. Roetta Sessions, MD FACP [facility] eSigned:  R. Roetta Sessions, MD FACP [facility] 10/06/2012 3:26 PM     CC:  PATIENT NAME:  Bryan Pace, Bryan Pace MR#: [id number]

## 2012-10-10 ENCOUNTER — Encounter (HOSPITAL_COMMUNITY): Payer: Self-pay | Admitting: Internal Medicine

## 2012-10-10 NOTE — ED Provider Notes (Signed)
Medical screening examination/treatment/procedure(s) were conducted as a shared visit with non-physician practitioner(s) and myself.  I personally evaluated the patient during the encounter.  Patient feeling better after IV fluids. No acute abdomen  Donnetta Hutching, MD 10/10/12 1208

## 2012-10-11 ENCOUNTER — Encounter: Payer: Self-pay | Admitting: Internal Medicine

## 2012-10-16 ENCOUNTER — Encounter (HOSPITAL_COMMUNITY): Payer: Self-pay | Admitting: *Deleted

## 2012-10-16 ENCOUNTER — Emergency Department (HOSPITAL_COMMUNITY)
Admission: EM | Admit: 2012-10-16 | Discharge: 2012-10-16 | Disposition: A | Discharge status: Home / Self Care | Payer: BC Managed Care – PPO | Attending: Emergency Medicine | Admitting: Emergency Medicine

## 2012-10-16 DIAGNOSIS — R109 Unspecified abdominal pain: Secondary | ICD-10-CM

## 2012-10-16 DIAGNOSIS — Z79899 Other long term (current) drug therapy: Secondary | ICD-10-CM | POA: Insufficient documentation

## 2012-10-16 DIAGNOSIS — R1110 Vomiting, unspecified: Secondary | ICD-10-CM | POA: Insufficient documentation

## 2012-10-16 DIAGNOSIS — Z87828 Personal history of other (healed) physical injury and trauma: Secondary | ICD-10-CM | POA: Insufficient documentation

## 2012-10-16 DIAGNOSIS — R1084 Generalized abdominal pain: Secondary | ICD-10-CM | POA: Insufficient documentation

## 2012-10-16 DIAGNOSIS — F17200 Nicotine dependence, unspecified, uncomplicated: Secondary | ICD-10-CM | POA: Insufficient documentation

## 2012-10-16 MED ORDER — ONDANSETRON 8 MG PO TBDP
8.0000 mg | ORAL_TABLET | Freq: Once | ORAL | Status: AC
  Administered 2012-10-16: 8 mg via ORAL
  Filled 2012-10-16: qty 1

## 2012-10-16 MED ORDER — ONDANSETRON 4 MG PO TBDP
4.0000 mg | ORAL_TABLET | Freq: Once | ORAL | Status: AC
Start: 2012-10-16 — End: 2012-10-16
  Administered 2012-10-16: 4 mg via ORAL
  Filled 2012-10-16: qty 1

## 2012-10-16 MED ORDER — GI COCKTAIL ~~LOC~~
30.0000 mL | Freq: Once | ORAL | Status: AC
  Administered 2012-10-16: 30 mL via ORAL
  Filled 2012-10-16: qty 30
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
## 2012-10-16 NOTE — ED Notes (Signed)
Pt sleeping chest rise and fall

## 2012-10-16 NOTE — ED Notes (Signed)
Continued abdominal pain since last visit. Has followed up with PMD and GI, states gastritis. Vomited x 1 this morning.

## 2012-11-15 ENCOUNTER — Encounter: Payer: Self-pay | Admitting: Internal Medicine

## 2012-11-16 ENCOUNTER — Encounter: Payer: Self-pay | Admitting: Gastroenterology

## 2012-11-16 ENCOUNTER — Other Ambulatory Visit: Payer: Self-pay | Admitting: Gastroenterology

## 2012-11-16 ENCOUNTER — Ambulatory Visit (INDEPENDENT_AMBULATORY_CARE_PROVIDER_SITE_OTHER): Payer: BC Managed Care – PPO | Admitting: Gastroenterology

## 2012-11-16 VITALS — BP 150/99 | HR 89 | Temp 98.2°F | Ht 72.0 in | Wt 170.2 lb

## 2012-11-16 DIAGNOSIS — R109 Unspecified abdominal pain: Secondary | ICD-10-CM

## 2012-11-16 MED ORDER — OMEPRAZOLE 20 MG PO CPDR: 20.0000 mg | DELAYED_RELEASE_CAPSULE | Freq: Two times a day (BID) | ORAL | Status: DC
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
## 2012-11-16 NOTE — Patient Instructions (Addendum)
Start taking Prilosec twice a day, 30 minutes before breakfast and dinner. I have sent the prescription to your pharmacy.   We have scheduled a HIDA scan in the near future to further evaluate your gallbladder.

## 2012-11-16 NOTE — Progress Notes (Signed)
Cc PCP 

## 2012-11-16 NOTE — Assessment & Plan Note (Signed)
47 year old male with upper abdominal and RUQ greater than LUQ pain, s/p EGD recently with NSAID gastropathy noted. Negative H.pylori. He is significantly improved since I saw him April 2014. Unable to tolerate Nexium; therefore, I have switched him to Prilosec BID and offered samples to get him started. Likely his symptoms were secondary to NSAID-induced injury; however, I question an underlying element of biliary dyskinesia, specifically with his vague RUQ discomfort. Will order a HIDA scan, as his [place] and CT have thus far been negative.   Prilosec BID HIDA scan 3 mos return with Dr. [name] Avoid NSAIDs indefinitely

## 2012-11-21 ENCOUNTER — Encounter (HOSPITAL_COMMUNITY): Payer: BC Managed Care – PPO

## 2015-11-01 DIAGNOSIS — J039 Acute tonsillitis, unspecified: Secondary | ICD-10-CM | POA: Diagnosis not present

## 2015-12-26 DIAGNOSIS — E782 Mixed hyperlipidemia: Secondary | ICD-10-CM | POA: Diagnosis not present

## 2015-12-26 DIAGNOSIS — I10 Essential (primary) hypertension: Secondary | ICD-10-CM | POA: Diagnosis not present

## 2015-12-31 DIAGNOSIS — K219 Gastro-esophageal reflux disease without esophagitis: Secondary | ICD-10-CM | POA: Diagnosis not present

## 2015-12-31 DIAGNOSIS — Z0001 Encounter for general adult medical examination with abnormal findings: Secondary | ICD-10-CM | POA: Diagnosis not present

## 2015-12-31 DIAGNOSIS — I10 Essential (primary) hypertension: Secondary | ICD-10-CM | POA: Diagnosis not present

## 2015-12-31 DIAGNOSIS — E782 Mixed hyperlipidemia: Secondary | ICD-10-CM | POA: Diagnosis not present

## 2016-08-10 DIAGNOSIS — I10 Essential (primary) hypertension: Secondary | ICD-10-CM | POA: Diagnosis not present

## 2016-08-10 DIAGNOSIS — K219 Gastro-esophageal reflux disease without esophagitis: Secondary | ICD-10-CM | POA: Diagnosis not present

## 2016-08-10 DIAGNOSIS — F5101 Primary insomnia: Secondary | ICD-10-CM | POA: Diagnosis not present

## 2016-10-28 DIAGNOSIS — H40053 Ocular hypertension, bilateral: Secondary | ICD-10-CM | POA: Diagnosis not present

## 2017-03-17 DIAGNOSIS — I10 Essential (primary) hypertension: Secondary | ICD-10-CM | POA: Diagnosis not present

## 2017-03-23 DIAGNOSIS — Z6825 Body mass index (BMI) 25.0-25.9, adult: Secondary | ICD-10-CM | POA: Diagnosis not present

## 2017-03-23 DIAGNOSIS — I10 Essential (primary) hypertension: Secondary | ICD-10-CM | POA: Diagnosis not present

## 2017-03-23 DIAGNOSIS — F5101 Primary insomnia: Secondary | ICD-10-CM | POA: Diagnosis not present

## 2017-03-23 DIAGNOSIS — K219 Gastro-esophageal reflux disease without esophagitis: Secondary | ICD-10-CM | POA: Diagnosis not present

## 2017-10-22 DIAGNOSIS — Z125 Encounter for screening for malignant neoplasm of prostate: Secondary | ICD-10-CM | POA: Diagnosis not present

## 2017-10-22 DIAGNOSIS — I10 Essential (primary) hypertension: Secondary | ICD-10-CM | POA: Diagnosis not present

## 2017-10-22 DIAGNOSIS — E782 Mixed hyperlipidemia: Secondary | ICD-10-CM | POA: Diagnosis not present

## 2017-10-27 DIAGNOSIS — K219 Gastro-esophageal reflux disease without esophagitis: Secondary | ICD-10-CM | POA: Diagnosis not present

## 2017-10-27 DIAGNOSIS — I10 Essential (primary) hypertension: Secondary | ICD-10-CM | POA: Diagnosis not present

## 2017-10-27 DIAGNOSIS — F5101 Primary insomnia: Secondary | ICD-10-CM | POA: Diagnosis not present

## 2017-10-27 DIAGNOSIS — Z0001 Encounter for general adult medical examination with abnormal findings: Secondary | ICD-10-CM | POA: Diagnosis not present

## 2017-10-29 ENCOUNTER — Encounter: Payer: Self-pay | Admitting: Internal Medicine

## 2017-12-06 ENCOUNTER — Ambulatory Visit: Payer: Self-pay

## 2018-01-04 ENCOUNTER — Ambulatory Visit (INDEPENDENT_AMBULATORY_CARE_PROVIDER_SITE_OTHER): Payer: Self-pay

## 2018-01-04 DIAGNOSIS — Z1211 Encounter for screening for malignant neoplasm of colon: Secondary | ICD-10-CM

## 2018-01-04 MED ORDER — NA SULFATE-K SULFATE-MG SULF 17.5-3.13-1.6 GM/177ML PO SOLN: 1.0000 | ORAL | 0 refills | Status: DC

## 2018-01-04 NOTE — Patient Instructions (Addendum)
Bryan Pace  1965/11/29 MRN: [id number]     Procedure Date: 04/15/18 Time to register: 7:30am Place to register: [facility] Procedure Time: 8:30am Scheduled provider: R. Garfield Cornea, MD    PREPARATION FOR COLONOSCOPY WITH SUPREP BOWEL PREP KIT  Note: Suprep Bowel Prep Kit is a split-dose (2day) regimen. Consumption of BOTH 6-ounce bottles is required for a complete prep.  Please notify us immediately if you are diabetic, take iron supplements, or if you are on Coumadin or any other blood thinners.                                                                                                                                                    2 DAYS BEFORE PROCEDURE:  DATE: 04/13/18   DAY: Wednesday Begin clear liquid diet AFTER your lunch meal. NO SOLID FOODS after this point.  1 DAY BEFORE PROCEDURE:  DATE: 04/14/18   DAY: Thursday Continue clear liquids the entire day - NO SOLID FOOD.     At 6:00pm: Complete steps 1 through 4 below, using ONE (1) 6-ounce bottle, before going to bed. Step 1:  Pour ONE (1) 6-ounce bottle of SUPREP liquid into the mixing container.  Step 2:  Add cool drinking water to the 16 ounce line on the container and mix.  Note: Dilute the solution concentrate as directed prior to use. Step 3:  DRINK ALL the liquid in the container. Step 4:  You MUST drink an additional two (2) or more 16 ounce containers of water over the next one (1) hour.   Continue clear liquids only EXCEPTION: If you take medications for your heart, blood pressure, or breathing, you may take these medications with a small amount of clear liquid.   DAY OF PROCEDURE:   DATE: 04/15/18  DAY: Friday    5 hours before your procedure at 3:30am: Step 1:  Pour ONE (1) 6-ounce bottle of SUPREP liquid into the mixing container.  Step 2:  Add cool drinking water to the 16 ounce line on the container and mix.  Note: Dilute the solution concentrate as directed prior to  use. Step 3:  DRINK ALL the liquid in the container. Step 4:  You MUST drink an additional two (2) or more 16 ounce containers of water  over the next one (1) hour. You MUST complete the final glass of water at least  3 hours before your colonoscopy.    Nothing by mouth past 5:30am  You may take your morning medications with sip of water unless we have instructed otherwise.    Please see below for Dietary Information.  CLEAR LIQUIDS INCLUDE:  Water Jello (NOT red in color)   Ice Popsicles (NOT red in color)   Tea (sugar ok, no milk/cream) Powdered fruit flavored drinks  Coffee (sugar ok, no milk/cream) Gatorade/ Lemonade/ Kool-Aid  (  NOT red in color)   Juice: apple, white grape, white cranberry Soft drinks  Clear bullion, consomme, broth (fat free beef/chicken/vegetable)  Carbonated beverages (any kind)  Strained chicken noodle soup Hard Candy   Remember: Clear liquids are liquids that will allow you to see your fingers on the other side of a clear glass. Be sure liquids are NOT red in color, and not cloudy, but CLEAR.  DO NOT EAT OR DRINK ANY OF THE FOLLOWING:  Dairy products of any kind   Cranberry juice Tomato juice / V8 juice   Grapefruit juice Orange juice     Red grape juice  Do not eat any solid foods, including such foods as: cereal, oatmeal, yogurt, fruits, vegetables, creamed soups, eggs, bread, crackers, pureed foods in a blender, etc.   HELPFUL HINTS FOR DRINKING PREP SOLUTION:   Make sure prep is extremely cold. Mix and refrigerate the the morning of the prep. You may also put in the freezer.   You may try mixing some Crystal Light or Country Time Lemonade if you prefer. Mix in small amounts; add more if necessary.  Try drinking through a straw  Rinse mouth with water or a mouthwash between glasses, to remove after-taste.  Try sipping on a cold beverage /ice/ popsicles between glasses of prep.  Place a piece of sugar-free hard candy in mouth between  glasses.  If you become nauseated, try consuming smaller amounts, or stretch out the time between glasses. Stop for 30-60 minutes, then slowly start back drinking.     OTHER INSTRUCTIONS  You will need a responsible adult at least 51 years of age to accompany you and drive you home. This person must remain in the waiting room during your procedure. The hospital will cancel your procedure if you do not have a responsible adult with you.   1. Wear loose fitting clothing that is easily removed. 2. Leave jewelry and other valuables at home.  3. Remove all body piercing jewelry and leave at home. 4. Total time from sign-in until discharge is approximately 2-3 hours. 5. You should go home directly after your procedure and rest. You can resume normal activities the day after your procedure. 6. The day of your procedure you should not:  Drive  Make legal decisions  Operate machinery  Drink alcohol  Return to work   You may call the office (Dept: [phone]) before 5:00pm, or page the doctor on call [phone]) after 5:00pm, for further instructions, if necessary.   Insurance Information YOU WILL NEED TO CHECK WITH YOUR INSURANCE COMPANY FOR THE BENEFITS OF COVERAGE YOU HAVE FOR THIS PROCEDURE.  UNFORTUNATELY, NOT ALL INSURANCE COMPANIES HAVE BENEFITS TO COVER ALL OR PART OF THESE TYPES OF PROCEDURES.  IT IS YOUR RESPONSIBILITY TO CHECK YOUR BENEFITS, HOWEVER, WE WILL BE GLAD TO ASSIST YOU WITH ANY CODES YOUR INSURANCE COMPANY MAY NEED.    PLEASE NOTE THAT MOST INSURANCE COMPANIES WILL NOT COVER A SCREENING COLONOSCOPY FOR PEOPLE UNDER THE AGE OF 50  IF YOU HAVE BCBS INSURANCE, YOU MAY HAVE BENEFITS FOR A SCREENING COLONOSCOPY BUT IF POLYPS ARE FOUND THE DIAGNOSIS WILL CHANGE AND THEN YOU MAY HAVE A DEDUCTIBLE THAT WILL NEED TO BE MET. SO PLEASE MAKE SURE YOU CHECK YOUR BENEFITS FOR A SCREENING COLONOSCOPY AS WELL AS A DIAGNOSTIC COLONOSCOPY.

## 2018-01-04 NOTE — Progress Notes (Addendum)
Gastroenterology Pre-Procedure Review  Request Date:01/04/18 Requesting Physician: Inetta Fermo (no previous tcs)  PATIENT REVIEW QUESTIONS: The patient responded to the following health history questions as indicated:    1. Diabetes Melitis: no 2. Joint replacements in the past 12 months: no 3. Major health problems in the past 3 months: no 4. Has an artificial valve or MVP: no 5. Has a defibrillator: no 6. Has been advised in past to take antibiotics in advance of a procedure like teeth cleaning: no 7. Family history of colon cancer: no  8. Alcohol Use: no 9. History of sleep apnea: no  10. History of coronary artery or other vascular stents placed within the last 12 months: no 11. History of any prior anesthesia complications: no    MEDICATIONS & ALLERGIES:    Patient reports the following regarding taking any blood thinners:   Plavix? no Aspirin? no Coumadin? no Brilinta? no Xarelto? no Eliquis? no Pradaxa? no Savaysa? no Effient? no  Patient confirms/reports the following medications:  Current Outpatient Medications  Medication Sig Dispense Refill  . ALPRAZolam (XANAX) 0.5 MG tablet Take 0.5 mg by mouth at bedtime as needed for sleep.    . irbesartan-hydrochlorothiazide (AVALIDE) 300-12.5 MG tablet Take 1 tablet by mouth daily.  0  . omeprazole (PRILOSEC) 20 MG capsule Take 1 capsule (20 mg total) by mouth 2 (two) times daily. 60 capsule 3   No current facility-administered medications for this visit.     Patient confirms/reports the following allergies:  No Known Allergies  No orders of the defined types were placed in this encounter.   AUTHORIZATION INFORMATION Primary Insurance: [employer],  [place] #: [id number] Pre-Cert / Josem Kaufmann required: no  Pre-Cert / Auth #:Debby, [id number]   SCHEDULE INFORMATION: Procedure has been scheduled as follows:  Date: 02/18/18, Time: 10:45 Location: [facility] Dr[facility]Rourk  This Gastroenterology Pre-Precedure Review Form is being routed  to the following provider(s): Neil Crouch, PA

## 2018-01-05 NOTE — Progress Notes (Signed)
Ok to schedule.

## 2018-02-11 ENCOUNTER — Telehealth: Payer: Self-pay | Admitting: Internal Medicine

## 2018-02-11 NOTE — Telephone Encounter (Signed)
Spoke with the pt, he does not have a ride next week. I have rescheduled him for 04/15/18, mailed him new instructions and I have spoke with Hoyle Sauer and she has moved his procedure.

## 2018-02-11 NOTE — Telephone Encounter (Signed)
[phone] patient called and stated he needs to reschedule his procedure

## 2018-02-11 NOTE — Progress Notes (Signed)
Pt called and rescheduled tcs to 04/15/18 I have called endo and changed appt and mailed the pt new instructions. He did not have anyone to bring him to the hospital.

## 2018-04-15 ENCOUNTER — Other Ambulatory Visit: Payer: Self-pay

## 2018-04-15 ENCOUNTER — Ambulatory Visit (HOSPITAL_COMMUNITY)
Admission: RE | Admit: 2018-04-15 | Discharge: 2018-04-15 | Disposition: A | Discharge status: Home / Self Care | Payer: BLUE CROSS/BLUE SHIELD | Source: Ambulatory Visit | Attending: Internal Medicine | Admitting: Internal Medicine

## 2018-04-15 ENCOUNTER — Encounter (HOSPITAL_COMMUNITY): Payer: Self-pay | Admitting: *Deleted

## 2018-04-15 ENCOUNTER — Encounter (HOSPITAL_COMMUNITY)
Admission: RE | Disposition: A | Discharge status: Home / Self Care | Payer: Self-pay | Source: Ambulatory Visit | Attending: Internal Medicine

## 2018-04-15 DIAGNOSIS — I10 Essential (primary) hypertension: Secondary | ICD-10-CM | POA: Diagnosis not present

## 2018-04-15 DIAGNOSIS — Z1211 Encounter for screening for malignant neoplasm of colon: Secondary | ICD-10-CM | POA: Diagnosis not present

## 2018-04-15 DIAGNOSIS — D120 Benign neoplasm of cecum: Secondary | ICD-10-CM

## 2018-04-15 DIAGNOSIS — D128 Benign neoplasm of rectum: Secondary | ICD-10-CM

## 2018-04-15 DIAGNOSIS — F17210 Nicotine dependence, cigarettes, uncomplicated: Secondary | ICD-10-CM | POA: Diagnosis not present

## 2018-04-15 DIAGNOSIS — Z79899 Other long term (current) drug therapy: Secondary | ICD-10-CM | POA: Insufficient documentation

## 2018-04-15 HISTORY — PX: POLYPECTOMY: SHX5525

## 2018-04-15 HISTORY — PX: COLONOSCOPY: SHX5424

## 2018-04-15 HISTORY — DX: Acute gastritis without bleeding: K29.00

## 2018-04-15 HISTORY — DX: Essential (primary) hypertension: I10

## 2018-04-15 SURGERY — COLONOSCOPY
Anesthesia: Moderate Sedation

## 2018-04-15 MED ORDER — MEPERIDINE HCL 50 MG/ML IJ SOLN
INTRAMUSCULAR | Status: AC
  Filled 2018-04-15: qty 1

## 2018-04-15 MED ORDER — MIDAZOLAM HCL 5 MG/5ML IJ SOLN
INTRAMUSCULAR | Status: DC | PRN
  Administered 2018-04-15: 1 mg via INTRAVENOUS
  Administered 2018-04-15 (×2): 2 mg via INTRAVENOUS

## 2018-04-15 MED ORDER — MIDAZOLAM HCL 5 MG/5ML IJ SOLN
INTRAMUSCULAR | Status: AC
  Filled 2018-04-15: qty 10

## 2018-04-15 MED ORDER — ONDANSETRON HCL 4 MG/2ML IJ SOLN
INTRAMUSCULAR | Status: DC | PRN
  Administered 2018-04-15: 4 mg via INTRAVENOUS

## 2018-04-15 MED ORDER — SODIUM CHLORIDE 0.9 % IV SOLN
INTRAVENOUS | Status: DC
  Administered 2018-04-15: 08:00:00 via INTRAVENOUS

## 2018-04-15 MED ORDER — ONDANSETRON HCL 4 MG/2ML IJ SOLN
INTRAMUSCULAR | Status: AC
  Filled 2018-04-15: qty 2

## 2018-04-15 MED ORDER — MEPERIDINE HCL 100 MG/ML IJ SOLN
INTRAMUSCULAR | Status: DC | PRN
  Administered 2018-04-15: 15 mg
  Administered 2018-04-15: 25 mg

## 2018-04-15 NOTE — Discharge Instructions (Signed)
Colon polyp information provided  Further recommendations to follow pending review of pathology report Colonoscopy Discharge Instructions  Read the instructions outlined below and refer to this sheet in the next few weeks. These discharge instructions provide you with general information on caring for yourself after you leave the hospital. Your doctor may also give you specific instructions. While your treatment has been planned according to the most current medical practices available, unavoidable complications occasionally occur. If you have any problems or questions after discharge, call Dr. Gala Romney at [phone]. ACTIVITY  You may resume your regular activity, but move at a slower pace for the next 24 hours.   Take frequent rest periods for the next 24 hours.   Walking will help get rid of the air and reduce the bloated feeling in your belly (abdomen).   No driving for 24 hours (because of the medicine (anesthesia) used during the test).    Do not sign any important legal documents or operate any machinery for 24 hours (because of the anesthesia used during the test).  NUTRITION  Drink plenty of fluids.   You may resume your normal diet as instructed by your doctor.   Begin with a light meal and progress to your normal diet. Heavy or fried foods are harder to digest and may make you feel sick to your stomach (nauseated).   Avoid alcoholic beverages for 24 hours or as instructed.  MEDICATIONS  You may resume your normal medications unless your doctor tells you otherwise.  WHAT YOU CAN EXPECT TODAY  Some feelings of bloating in the abdomen.   Passage of more gas than usual.   Spotting of blood in your stool or on the toilet paper.  IF YOU HAD POLYPS REMOVED DURING THE COLONOSCOPY:  No aspirin products for 7 days or as instructed.   No alcohol for 7 days or as instructed.   Eat a soft diet for the next 24 hours.  FINDING OUT THE RESULTS OF YOUR TEST Not all test results are  available during your visit. If your test results are not back during the visit, make an appointment with your caregiver to find out the results. Do not assume everything is normal if you have not heard from your caregiver or the medical facility. It is important for you to follow up on all of your test results.  SEEK IMMEDIATE MEDICAL ATTENTION IF:  You have more than a spotting of blood in your stool.   Your belly is swollen (abdominal distention).   You are nauseated or vomiting.   You have a temperature over 101.   You have abdominal pain or discomfort that is severe or gets worse throughout the day.       Colon Polyps Polyps are tissue growths inside the body. Polyps can grow in many places, including the large intestine (colon). A polyp may be a round bump or a mushroom-shaped growth. You could have one polyp or several. Most colon polyps are noncancerous (benign). However, some colon polyps can become cancerous over time. What are the causes? The exact cause of colon polyps is not known. What increases the risk? This condition is more likely to develop in people who:  Have a family history of colon cancer or colon polyps.  Are older than 57 or older than 45 if they are African American.  Have inflammatory bowel disease, such as ulcerative colitis or Crohn disease.  Are overweight.  Smoke cigarettes.  Do not get enough exercise.  Drink too much  Eat a diet that is: °? High in fat and red meat. °? Low in fiber. °· Had childhood cancer that was treated with abdominal radiation. ° °What are the signs or symptoms? °Most polyps do not cause symptoms. If you have symptoms, they may include: °· Blood coming from your rectum when having a bowel movement. °· Blood in your stool. The stool may look dark red or black. °· A change in bowel habits, such as constipation or diarrhea. ° °How is this diagnosed? °This condition is diagnosed with a colonoscopy. This is a procedure  that uses a lighted, flexible scope to look at the inside of your colon. °How is this treated? °Treatment for this condition involves removing any polyps that are found. Those polyps will then be tested for cancer. If cancer is found, your health care provider will talk to you about options for colon cancer treatment. °Follow these instructions at home: °Diet °· Eat plenty of fiber, such as fruits, vegetables, and whole grains. °· Eat foods that are high in calcium and vitamin D, such as milk, cheese, yogurt, eggs, liver, fish, and broccoli. °· Limit foods high in fat, red meats, and processed meats, such as hot dogs, sausage, bacon, and lunch meats. °· Maintain a healthy weight, or lose weight if recommended by your health care provider. °General instructions °· Do not smoke cigarettes. °· Do not drink alcohol excessively. °· Keep all follow-up visits as told by your health care provider. This is important. This includes keeping regularly scheduled colonoscopies. Talk to your health care provider about when you need a colonoscopy. °· Exercise every day or as told by your health care provider. °Contact a health care provider if: °· You have new or worsening bleeding during a bowel movement. °· You have new or increased blood in your stool. °· You have a change in bowel habits. °· You unexpectedly lose weight. °This information is not intended to replace advice given to you by your health care provider. Make sure you discuss any questions you have with your health care provider. °Document Released: 03/18/2004 Document Revised: 11/28/2015 Document Reviewed: 05/13/2015 °Elsevier Interactive Patient Education © 2018 Elsevier Inc. ° °

## 2018-04-15 NOTE — Op Note (Signed)
[facility] Patient Name: Bryan Pace Procedure Date: 04/15/2018 7:57 AM MRN: [id number] Date of Birth: Oct 13, 1965 Attending MD: Norvel Richards , MD CSN: [id number] Age: 52 Admit Type: Outpatient Procedure:                Colonoscopy Indications:              Screening for colorectal malignant neoplasm Providers:                Norvel Richards, MD, Janeece Riggers, RN, Aram Candela Referring MD:              Medicines:                Midazolam 5 mg IV, Meperidine 40 mg IV Complications:            No immediate complications. Estimated Blood Loss:     Estimated blood loss was minimal. Procedure:                After obtaining informed consent, the colonoscope                            was passed under direct vision. Throughout the                            procedure, the patient's blood pressure, pulse, and                            oxygen saturations were monitored continuously. The                            CF-[id number] ([id number]) scope was introduced through                            the anus and advanced to the the cecum, identified                            by appendiceal orifice and ileocecal valve. Scope In: 8:10:33 AM Scope Out: 8:28:05 AM Scope Withdrawal Time: 0 hours 9 minutes 49 seconds  Total Procedure Duration: 0 hours 17 minutes 32 seconds  Findings:      The perianal and digital rectal examinations were normal.      A 13 mm polyp was found in the rectum -approximately 6 cm from the anal       verge. The polyp was semi-pedunculated. The polyp was removed with a hot       snare. Resection and retrieval were complete. Estimated blood loss: none.      A 8 mm polyp was found in the cecum. The polyp was sessile. The polyp       was removed with a cold snare. Resection and retrieval were complete.       Estimated blood loss was minimal.      The exam was otherwise without abnormality on direct and retroflexion        views. Impression:               - One 13 mm polyp in the  rectum, removed with a hot                            snare. Resected and retrieved.                           - One 8 mm polyp in the cecum, removed with a cold                            snare. Resected and retrieved.                           - The examination was otherwise normal on direct                            and retroflexion views. Moderate Sedation:      Moderate (conscious) sedation was administered by the endoscopy nurse       and supervised by the endoscopist. The following parameters were       monitored: oxygen saturation, heart rate, blood pressure, respiratory       rate, EKG, adequacy of pulmonary ventilation, and response to care.       Total physician intraservice time was 21 minutes. Recommendation:           - Patient has a contact number available for                            emergencies. The signs and symptoms of potential                            delayed complications were discussed with the                            patient. Return to normal activities tomorrow.                            Written discharge instructions were provided to the                            patient.                           - Resume previous diet.                           - Continue present medications.                           - Repeat colonoscopy date to be determined after                            pending pathology results are reviewed for                            surveillance based on pathology results.                           -  Return to GI office (date not yet determined). Procedure Code(s):        --- Professional ---                           [phone], Colonoscopy, flexible; with removal of                            tumor(s), polyp(s), or other lesion(s) by snare                            technique                           G0500, Moderate sedation services provided by the                            same  physician or other qualified health care                            professional performing a gastrointestinal                            endoscopic service that sedation supports,                            requiring the presence of an independent trained                            observer to assist in the monitoring of the                            patient's level of consciousness and physiological                            status; initial 15 minutes of intra-service time;                            patient age 38 years or older (additional time may                            be reported with [phone], as appropriate) Diagnosis Code(s):        --- Professional ---                           Z12.11, Encounter for screening for malignant                            neoplasm of colon                           K62.1, Rectal polyp                           D12.0, Benign neoplasm of cecum CPT copyright 2018 American Medical Association. All rights reserved. The codes documented in this report are  preliminary and upon coder review may  be revised to meet current compliance requirements. Cristopher Estimable. Jervon Ream, MD Norvel Richards, MD 04/15/2018 8:42:58 AM This report has been signed electronically. Number of Addenda: 0
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
## 2018-04-18 ENCOUNTER — Encounter: Payer: Self-pay | Admitting: Internal Medicine

## 2018-04-21 ENCOUNTER — Encounter (HOSPITAL_COMMUNITY): Payer: Self-pay | Admitting: Internal Medicine

## 2018-04-28 DIAGNOSIS — I10 Essential (primary) hypertension: Secondary | ICD-10-CM | POA: Diagnosis not present

## 2018-05-04 DIAGNOSIS — I10 Essential (primary) hypertension: Secondary | ICD-10-CM | POA: Diagnosis not present

## 2018-05-04 DIAGNOSIS — F5101 Primary insomnia: Secondary | ICD-10-CM | POA: Diagnosis not present

## 2018-05-04 DIAGNOSIS — K219 Gastro-esophageal reflux disease without esophagitis: Secondary | ICD-10-CM | POA: Diagnosis not present

## 2018-11-08 DIAGNOSIS — Z1329 Encounter for screening for other suspected endocrine disorder: Secondary | ICD-10-CM | POA: Diagnosis not present

## 2018-11-08 DIAGNOSIS — I10 Essential (primary) hypertension: Secondary | ICD-10-CM | POA: Diagnosis not present

## 2018-11-14 DIAGNOSIS — K219 Gastro-esophageal reflux disease without esophagitis: Secondary | ICD-10-CM | POA: Diagnosis not present

## 2018-11-14 DIAGNOSIS — G4700 Insomnia, unspecified: Secondary | ICD-10-CM | POA: Diagnosis not present

## 2018-11-14 DIAGNOSIS — I10 Essential (primary) hypertension: Secondary | ICD-10-CM | POA: Diagnosis not present

## 2019-01-24 DIAGNOSIS — F5101 Primary insomnia: Secondary | ICD-10-CM | POA: Diagnosis not present

## 2019-01-24 DIAGNOSIS — G4700 Insomnia, unspecified: Secondary | ICD-10-CM | POA: Diagnosis not present

## 2019-01-24 DIAGNOSIS — R5383 Other fatigue: Secondary | ICD-10-CM | POA: Diagnosis not present

## 2019-01-24 DIAGNOSIS — M6281 Muscle weakness (generalized): Secondary | ICD-10-CM | POA: Diagnosis not present

## 2019-01-24 DIAGNOSIS — R439 Unspecified disturbances of smell and taste: Secondary | ICD-10-CM | POA: Diagnosis not present

## 2019-01-24 DIAGNOSIS — K219 Gastro-esophageal reflux disease without esophagitis: Secondary | ICD-10-CM | POA: Diagnosis not present

## 2019-01-24 DIAGNOSIS — M7918 Myalgia, other site: Secondary | ICD-10-CM | POA: Diagnosis not present

## 2019-01-24 DIAGNOSIS — I10 Essential (primary) hypertension: Secondary | ICD-10-CM | POA: Diagnosis not present

## 2019-01-27 DIAGNOSIS — R1084 Generalized abdominal pain: Secondary | ICD-10-CM | POA: Diagnosis not present

## 2019-01-27 DIAGNOSIS — R112 Nausea with vomiting, unspecified: Secondary | ICD-10-CM | POA: Diagnosis not present

## 2019-01-27 DIAGNOSIS — R197 Diarrhea, unspecified: Secondary | ICD-10-CM | POA: Diagnosis not present

## 2019-09-04 DIAGNOSIS — I10 Essential (primary) hypertension: Secondary | ICD-10-CM | POA: Diagnosis not present

## 2019-09-07 DIAGNOSIS — F5101 Primary insomnia: Secondary | ICD-10-CM | POA: Diagnosis not present

## 2019-09-07 DIAGNOSIS — K219 Gastro-esophageal reflux disease without esophagitis: Secondary | ICD-10-CM | POA: Diagnosis not present

## 2019-09-07 DIAGNOSIS — I10 Essential (primary) hypertension: Secondary | ICD-10-CM | POA: Diagnosis not present

## 2019-09-07 DIAGNOSIS — G4700 Insomnia, unspecified: Secondary | ICD-10-CM | POA: Diagnosis not present

## 2019-11-09 ENCOUNTER — Ambulatory Visit: Payer: BC Managed Care – PPO | Attending: Internal Medicine

## 2019-11-09 DIAGNOSIS — Z23 Encounter for immunization: Secondary | ICD-10-CM

## 2019-11-09 NOTE — Progress Notes (Signed)
   Covid-19 Vaccination Clinic  Name:  Bryan Pace    MRN: [id number] DOB: 1965-09-29  11/09/2019  Bryan Pace was observed post Covid-19 immunization for 15 minutes without incident. He was provided with Vaccine Information Sheet and instruction to access the V-Safe system.   Bryan Pace was instructed to call 911 with any severe reactions post vaccine: Bryan Pace Kitchen Difficulty breathing  . Swelling of face and throat  . A fast heartbeat  . A bad rash all over body  . Dizziness and weakness   Immunizations Administered    Name Date Dose VIS Date Route   Moderna COVID-19 Vaccine 11/09/2019  9:56 AM 0.5 mL 06/2019 Intramuscular   Manufacturer: Moderna   Lot: [id number]   [id number]

## 2019-12-07 ENCOUNTER — Ambulatory Visit: Payer: BC Managed Care – PPO | Attending: Internal Medicine

## 2019-12-07 DIAGNOSIS — Z23 Encounter for immunization: Secondary | ICD-10-CM

## 2019-12-07 NOTE — Progress Notes (Signed)
   Covid-19 Vaccination Clinic  Name:  Bryan Pace    MRN: [id number] DOB: 1965/09/10  12/07/2019  [name] was observed post Covid-19 immunization for 15 minutes without incident. He was provided with Vaccine Information Sheet and instruction to access the V-Safe system.   [name] was instructed to call 911 with any severe reactions post vaccine: Marland Kitchen Difficulty breathing  . Swelling of face and throat  . A fast heartbeat  . A bad rash all over body  . Dizziness and weakness   Immunizations Administered    Name Date Dose VIS Date Route   Moderna COVID-19 Vaccine 12/07/2019 10:06 AM 0.5 mL 06/2019 Intramuscular   Manufacturer: Moderna   Lot: [id number]   KemmererVO:[id number]

## 2020-03-29 DIAGNOSIS — R5383 Other fatigue: Secondary | ICD-10-CM | POA: Diagnosis not present

## 2020-03-29 DIAGNOSIS — K219 Gastro-esophageal reflux disease without esophagitis: Secondary | ICD-10-CM | POA: Diagnosis not present

## 2020-03-29 DIAGNOSIS — D519 Vitamin B12 deficiency anemia, unspecified: Secondary | ICD-10-CM | POA: Diagnosis not present

## 2020-03-29 DIAGNOSIS — I10 Essential (primary) hypertension: Secondary | ICD-10-CM | POA: Diagnosis not present

## 2020-03-29 DIAGNOSIS — R439 Unspecified disturbances of smell and taste: Secondary | ICD-10-CM | POA: Diagnosis not present

## 2020-03-29 DIAGNOSIS — F5101 Primary insomnia: Secondary | ICD-10-CM | POA: Diagnosis not present

## 2020-04-03 DIAGNOSIS — Z0001 Encounter for general adult medical examination with abnormal findings: Secondary | ICD-10-CM | POA: Diagnosis not present

## 2020-04-03 DIAGNOSIS — Z23 Encounter for immunization: Secondary | ICD-10-CM | POA: Diagnosis not present

## 2020-06-05 DIAGNOSIS — U071 COVID-19: Secondary | ICD-10-CM | POA: Diagnosis not present

## 2020-06-10 DIAGNOSIS — U071 COVID-19: Secondary | ICD-10-CM | POA: Diagnosis not present

## 2020-10-02 DIAGNOSIS — M7918 Myalgia, other site: Secondary | ICD-10-CM | POA: Diagnosis not present

## 2020-10-02 DIAGNOSIS — I10 Essential (primary) hypertension: Secondary | ICD-10-CM | POA: Diagnosis not present

## 2020-10-02 DIAGNOSIS — R197 Diarrhea, unspecified: Secondary | ICD-10-CM | POA: Diagnosis not present

## 2020-10-03 DIAGNOSIS — F5101 Primary insomnia: Secondary | ICD-10-CM | POA: Diagnosis not present

## 2020-10-03 DIAGNOSIS — R944 Abnormal results of kidney function studies: Secondary | ICD-10-CM | POA: Diagnosis not present

## 2020-10-03 DIAGNOSIS — I10 Essential (primary) hypertension: Secondary | ICD-10-CM | POA: Diagnosis not present

## 2020-10-03 DIAGNOSIS — K219 Gastro-esophageal reflux disease without esophagitis: Secondary | ICD-10-CM | POA: Diagnosis not present

## 2021-03-11 ENCOUNTER — Encounter: Payer: Self-pay | Admitting: *Deleted

## 2021-04-11 DIAGNOSIS — I10 Essential (primary) hypertension: Secondary | ICD-10-CM | POA: Diagnosis not present

## 2021-04-11 DIAGNOSIS — R7301 Impaired fasting glucose: Secondary | ICD-10-CM | POA: Diagnosis not present

## 2021-04-14 DIAGNOSIS — Z0001 Encounter for general adult medical examination with abnormal findings: Secondary | ICD-10-CM | POA: Diagnosis not present

## 2021-04-14 DIAGNOSIS — I4892 Unspecified atrial flutter: Secondary | ICD-10-CM | POA: Diagnosis not present

## 2021-04-17 ENCOUNTER — Encounter: Payer: Self-pay | Admitting: *Deleted

## 2021-04-18 ENCOUNTER — Ambulatory Visit: Payer: BC Managed Care – PPO | Admitting: Cardiology

## 2021-04-18 ENCOUNTER — Encounter: Payer: Self-pay | Admitting: Cardiology

## 2021-04-18 ENCOUNTER — Other Ambulatory Visit: Payer: Self-pay

## 2021-04-18 VITALS — BP 126/74 | HR 149 | Ht 72.0 in | Wt 173.6 lb

## 2021-04-18 DIAGNOSIS — I4892 Unspecified atrial flutter: Secondary | ICD-10-CM | POA: Diagnosis not present

## 2021-04-18 MED ORDER — RIVAROXABAN 20 MG PO TABS: 20.0000 mg | ORAL_TABLET | Freq: Every day | ORAL | 6 refills | Status: DC

## 2021-04-18 NOTE — Patient Instructions (Signed)
Medication Instructions:  Your physician has recommended you make the following change in your medication:  START Xarelto 20 mg tablets daily  *If you need a refill on your cardiac medications before your next appointment, please call your pharmacy*   Lab Work: None If you have labs (blood work) drawn today and your tests are completely normal, you will receive your results only by: [place] (if you have MyChart) OR A paper copy in the mail If you have any lab test that is abnormal or we need to change your treatment, we will call you to review the results.   Testing/Procedures: None   Follow-Up: At [facility], you and your health needs are our priority.  As part of our continuing mission to provide you with exceptional heart care, we have created designated Provider Care Teams.  These Care Teams include your primary Cardiologist (physician) and Advanced Practice Providers (APPs -  Physician Assistants and Nurse Practitioners) who all work together to provide you with the care you need, when you need it.  We recommend signing up for the patient portal called "MyChart".  Sign up information is provided on this After Visit Summary.  MyChart is used to connect with patients for Virtual Visits (Telemedicine).  Patients are able to view lab/test results, encounter notes, upcoming appointments, etc.  Non-urgent messages can be sent to your provider as well.   To learn more about what you can do with MyChart, go to [email or web address].    Your next appointment:   6 week(s)  The format for your next appointment:   In Person  Provider:   You may see Carlyle Dolly, MD or the following Advanced Practice Provider on your designated Care Team:   Katina Dung, NP   Other Instructions Tuesday- Nurse Visit- EKG/VItals
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
## 2021-04-20 DIAGNOSIS — Z20822 Contact with and (suspected) exposure to covid-19: Secondary | ICD-10-CM | POA: Diagnosis not present

## 2021-04-22 ENCOUNTER — Ambulatory Visit (INDEPENDENT_AMBULATORY_CARE_PROVIDER_SITE_OTHER): Payer: BC Managed Care – PPO | Admitting: *Deleted

## 2021-04-22 ENCOUNTER — Telehealth: Payer: Self-pay | Admitting: Cardiology

## 2021-04-22 VITALS — BP 98/68 | HR 145

## 2021-04-22 DIAGNOSIS — I4892 Unspecified atrial flutter: Secondary | ICD-10-CM

## 2021-04-22 MED ORDER — METOPROLOL TARTRATE 50 MG PO TABS: 75.0000 mg | ORAL_TABLET | Freq: Two times a day (BID) | ORAL | Status: DC

## 2021-04-22 NOTE — Progress Notes (Signed)
Patient in office for EKG & vitals.    Per Dr. Harl Bowie review -  Increase Lopressor to 75mg  twice a day  Stop Avalide (Ibersartan/HCTZ). Schedule TEE/DCCV this or next week   Patient notified and verbalized understanding.

## 2021-04-22 NOTE — Telephone Encounter (Signed)
Patient presented for nursing visit. EKG shows he remains in aflutter with RVR. BP manual repeat 100/60. He will stop his irbesartan/hctz, increase lopressor to 75mg  bid, and plan for TEE/DCCV either this week or next.  Carlyle Dolly MD

## 2021-04-23 ENCOUNTER — Observation Stay (HOSPITAL_COMMUNITY): Payer: BC Managed Care – PPO

## 2021-04-23 ENCOUNTER — Encounter (HOSPITAL_COMMUNITY): Payer: Self-pay

## 2021-04-23 ENCOUNTER — Observation Stay (HOSPITAL_BASED_OUTPATIENT_CLINIC_OR_DEPARTMENT_OTHER): Payer: BC Managed Care – PPO

## 2021-04-23 ENCOUNTER — Inpatient Hospital Stay (HOSPITAL_COMMUNITY)
Admission: EM | Admit: 2021-04-23 | Discharge: 2021-04-28 | DRG: 308 | Disposition: A | Discharge status: Home / Self Care | Payer: BC Managed Care – PPO | Attending: Internal Medicine | Admitting: Internal Medicine

## 2021-04-23 ENCOUNTER — Emergency Department (HOSPITAL_COMMUNITY): Payer: BC Managed Care – PPO

## 2021-04-23 ENCOUNTER — Other Ambulatory Visit: Payer: Self-pay

## 2021-04-23 DIAGNOSIS — Z7901 Long term (current) use of anticoagulants: Secondary | ICD-10-CM | POA: Diagnosis not present

## 2021-04-23 DIAGNOSIS — F1010 Alcohol abuse, uncomplicated: Secondary | ICD-10-CM | POA: Diagnosis not present

## 2021-04-23 DIAGNOSIS — R0689 Other abnormalities of breathing: Secondary | ICD-10-CM

## 2021-04-23 DIAGNOSIS — R0602 Shortness of breath: Secondary | ICD-10-CM | POA: Diagnosis not present

## 2021-04-23 DIAGNOSIS — I484 Atypical atrial flutter: Secondary | ICD-10-CM

## 2021-04-23 DIAGNOSIS — I110 Hypertensive heart disease with heart failure: Secondary | ICD-10-CM | POA: Diagnosis present

## 2021-04-23 DIAGNOSIS — D509 Iron deficiency anemia, unspecified: Secondary | ICD-10-CM | POA: Diagnosis not present

## 2021-04-23 DIAGNOSIS — F17210 Nicotine dependence, cigarettes, uncomplicated: Secondary | ICD-10-CM | POA: Diagnosis present

## 2021-04-23 DIAGNOSIS — J90 Pleural effusion, not elsewhere classified: Secondary | ICD-10-CM | POA: Diagnosis not present

## 2021-04-23 DIAGNOSIS — Z720 Tobacco use: Secondary | ICD-10-CM | POA: Diagnosis present

## 2021-04-23 DIAGNOSIS — I959 Hypotension, unspecified: Secondary | ICD-10-CM | POA: Diagnosis present

## 2021-04-23 DIAGNOSIS — I5021 Acute systolic (congestive) heart failure: Secondary | ICD-10-CM | POA: Diagnosis present

## 2021-04-23 DIAGNOSIS — E871 Hypo-osmolality and hyponatremia: Secondary | ICD-10-CM | POA: Diagnosis present

## 2021-04-23 DIAGNOSIS — I429 Cardiomyopathy, unspecified: Secondary | ICD-10-CM | POA: Diagnosis present

## 2021-04-23 DIAGNOSIS — Z23 Encounter for immunization: Secondary | ICD-10-CM | POA: Diagnosis not present

## 2021-04-23 DIAGNOSIS — I083 Combined rheumatic disorders of mitral, aortic and tricuspid valves: Secondary | ICD-10-CM | POA: Diagnosis not present

## 2021-04-23 DIAGNOSIS — I4819 Other persistent atrial fibrillation: Secondary | ICD-10-CM | POA: Diagnosis present

## 2021-04-23 DIAGNOSIS — Z79899 Other long term (current) drug therapy: Secondary | ICD-10-CM

## 2021-04-23 DIAGNOSIS — E876 Hypokalemia: Secondary | ICD-10-CM | POA: Diagnosis not present

## 2021-04-23 DIAGNOSIS — K219 Gastro-esophageal reflux disease without esophagitis: Secondary | ICD-10-CM | POA: Diagnosis present

## 2021-04-23 DIAGNOSIS — I081 Rheumatic disorders of both mitral and tricuspid valves: Secondary | ICD-10-CM | POA: Diagnosis not present

## 2021-04-23 DIAGNOSIS — Z20822 Contact with and (suspected) exposure to covid-19: Secondary | ICD-10-CM | POA: Diagnosis present

## 2021-04-23 DIAGNOSIS — I10 Essential (primary) hypertension: Secondary | ICD-10-CM | POA: Diagnosis not present

## 2021-04-23 DIAGNOSIS — R0789 Other chest pain: Secondary | ICD-10-CM | POA: Diagnosis not present

## 2021-04-23 DIAGNOSIS — J9601 Acute respiratory failure with hypoxia: Secondary | ICD-10-CM | POA: Diagnosis present

## 2021-04-23 DIAGNOSIS — I483 Typical atrial flutter: Principal | ICD-10-CM | POA: Diagnosis present

## 2021-04-23 DIAGNOSIS — R9431 Abnormal electrocardiogram [ECG] [EKG]: Secondary | ICD-10-CM | POA: Diagnosis not present

## 2021-04-23 DIAGNOSIS — R0600 Dyspnea, unspecified: Secondary | ICD-10-CM | POA: Diagnosis not present

## 2021-04-23 DIAGNOSIS — R195 Other fecal abnormalities: Secondary | ICD-10-CM | POA: Diagnosis present

## 2021-04-23 DIAGNOSIS — I340 Nonrheumatic mitral (valve) insufficiency: Secondary | ICD-10-CM | POA: Diagnosis not present

## 2021-04-23 DIAGNOSIS — I4892 Unspecified atrial flutter: Secondary | ICD-10-CM | POA: Diagnosis present

## 2021-04-23 HISTORY — DX: Unspecified atrial flutter: I48.92

## 2021-04-23 LAB — HEPATIC FUNCTION PANEL
ALT: 13 U/L (ref 0–44)
AST: 18 U/L (ref 15–41)
Albumin: 4.1 g/dL (ref 3.5–5.0)
Alkaline Phosphatase: 84 U/L (ref 38–126)
Bilirubin, Direct: 0.1 mg/dL (ref 0.0–0.2)
Indirect Bilirubin: 0.7 mg/dL (ref 0.3–0.9)
Total Bilirubin: 0.8 mg/dL (ref 0.3–1.2)
Total Protein: 7.4 g/dL (ref 6.5–8.1)

## 2021-04-23 LAB — RESP PANEL BY RT-PCR (FLU A&B, COVID) ARPGX2
Influenza A by PCR: NEGATIVE
Influenza B by PCR: NEGATIVE
SARS Coronavirus 2 by RT PCR: NEGATIVE

## 2021-04-23 LAB — HIV ANTIBODY (ROUTINE TESTING W REFLEX): HIV Screen 4th Generation wRfx: NONREACTIVE

## 2021-04-23 LAB — CBC
HCT: 36.7 % — ABNORMAL LOW (ref 39.0–52.0)
Hemoglobin: 12.4 g/dL — ABNORMAL LOW (ref 13.0–17.0)
MCH: 29 pg (ref 26.0–34.0)
MCHC: 33.8 g/dL (ref 30.0–36.0)
MCV: 85.7 fL (ref 80.0–100.0)
Platelets: 351 10*3/uL (ref 150–400)
RBC: 4.28 MIL/uL (ref 4.22–5.81)
RDW: 13.9 % (ref 11.5–15.5)
WBC: 5.2 10*3/uL (ref 4.0–10.5)
nRBC: 0 % (ref 0.0–0.2)

## 2021-04-23 LAB — ECHOCARDIOGRAM COMPLETE
Area-P 1/2: 5.02 cm2
Height: 72 in
S' Lateral: 5.2 cm
Weight: 2800 oz

## 2021-04-23 LAB — TROPONIN I (HIGH SENSITIVITY)
Troponin I (High Sensitivity): 40 ng/L — ABNORMAL HIGH (ref <18)
Troponin I (High Sensitivity): 36 ng/L — ABNORMAL HIGH (ref <18)

## 2021-04-23 LAB — BASIC METABOLIC PANEL
Anion gap: 9 (ref 5–15)
Anion gap: 10 (ref 5–15)
BUN: 10 mg/dL (ref 6–20)
BUN: 11 mg/dL (ref 6–20)
CO2: 23 mmol/L (ref 22–32)
CO2: 20 mmol/L — ABNORMAL LOW (ref 22–32)
Calcium: 8.8 mg/dL — ABNORMAL LOW (ref 8.9–10.3)
Calcium: 8.2 mg/dL — ABNORMAL LOW (ref 8.9–10.3)
Chloride: 87 mmol/L — ABNORMAL LOW (ref 98–111)
Chloride: 85 mmol/L — ABNORMAL LOW (ref 98–111)
Creatinine, Ser: 0.91 mg/dL (ref 0.61–1.24)
Creatinine, Ser: 0.91 mg/dL (ref 0.61–1.24)
GFR, Estimated: >60 mL/min (ref >60)
GFR, Estimated: >60 mL/min (ref >60)
Glucose, Bld: 115 mg/dL — ABNORMAL HIGH (ref 70–99)
Glucose, Bld: 132 mg/dL — ABNORMAL HIGH (ref 70–99)
Potassium: 4 mmol/L (ref 3.5–5.1)
Potassium: 5 mmol/L (ref 3.5–5.1)
Sodium: 119 mmol/L — CL (ref 135–145)
Sodium: 115 mmol/L — CL (ref 135–145)

## 2021-04-23 LAB — BRAIN NATRIURETIC PEPTIDE: B Natriuretic Peptide: 521 pg/mL — ABNORMAL HIGH (ref 0.0–100.0)

## 2021-04-23 LAB — IRON AND TIBC
Iron: 23 ug/dL — ABNORMAL LOW (ref 45–182)
Saturation Ratios: 4 % — ABNORMAL LOW (ref 17.9–39.5)
TIBC: 529 ug/dL — ABNORMAL HIGH (ref 250–450)
UIBC: 506 ug/dL

## 2021-04-23 LAB — FERRITIN: Ferritin: 15 ng/mL — ABNORMAL LOW (ref 24–336)

## 2021-04-23 LAB — SODIUM
Sodium: 114 mmol/L — CL (ref 135–145)
Sodium: 116 mmol/L — CL (ref 135–145)

## 2021-04-23 LAB — MAGNESIUM: Magnesium: 1.5 mg/dL — ABNORMAL LOW (ref 1.7–2.4)

## 2021-04-23 MED ORDER — SODIUM CHLORIDE 3 % IV SOLN
INTRAVENOUS | Status: DC
  Filled 2021-04-23: qty 500

## 2021-04-23 MED ORDER — METOPROLOL TARTRATE 25 MG PO TABS: 25.0000 mg | ORAL_TABLET | Freq: Two times a day (BID) | ORAL | Status: DC

## 2021-04-23 MED ORDER — THIAMINE HCL 100 MG/ML IJ SOLN
100.0000 mg | Freq: Every day | INTRAMUSCULAR | Status: DC
  Filled 2021-04-23 (×2): qty 2

## 2021-04-23 MED ORDER — ALPRAZOLAM 0.5 MG PO TABS: 1.0000 mg | ORAL_TABLET | Freq: Every evening | ORAL | Status: DC | PRN

## 2021-04-23 MED ORDER — BISACODYL 10 MG RE SUPP: 10.0000 mg | Freq: Every day | RECTAL | Status: DC | PRN

## 2021-04-23 MED ORDER — SODIUM CHLORIDE 0.9% FLUSH
3.0000 mL | Freq: Two times a day (BID) | INTRAVENOUS | Status: DC
  Administered 2021-04-23 – 2021-04-26 (×5): 3 mL via INTRAVENOUS

## 2021-04-23 MED ORDER — SODIUM CHLORIDE 0.9% FLUSH
3.0000 mL | Freq: Two times a day (BID) | INTRAVENOUS | Status: DC
  Administered 2021-04-23 – 2021-04-27 (×6): 3 mL via INTRAVENOUS

## 2021-04-23 MED ORDER — MAGNESIUM SULFATE 4 GM/100ML IV SOLN
4.0000 g | Freq: Once | INTRAVENOUS | Status: AC
  Administered 2021-04-23: 4 g via INTRAVENOUS
  Filled 2021-04-23: qty 100

## 2021-04-23 MED ORDER — SODIUM CHLORIDE 0.9 % IV SOLN: INTRAVENOUS | Status: DC

## 2021-04-23 MED ORDER — LORAZEPAM 1 MG PO TABS
1.0000 mg | ORAL_TABLET | ORAL | Status: AC | PRN
  Administered 2021-04-26: 1 mg via ORAL
  Filled 2021-04-23 (×2): qty 1

## 2021-04-23 MED ORDER — POLYETHYLENE GLYCOL 3350 17 G PO PACK: 17.0000 g | PACK | Freq: Every day | ORAL | Status: DC | PRN

## 2021-04-23 MED ORDER — DILTIAZEM HCL-DEXTROSE 125-5 MG/125ML-% IV SOLN (PREMIX)
5.0000 mg/h | INTRAVENOUS | Status: DC
  Administered 2021-04-23: 5 mg/h via INTRAVENOUS
  Administered 2021-04-24: 15 mg/h via INTRAVENOUS
  Administered 2021-04-25 – 2021-04-26 (×3): 12.5 mg/h via INTRAVENOUS
  Administered 2021-04-27 – 2021-04-28 (×3): 10.5 mg/h via INTRAVENOUS
  Filled 2021-04-23 (×10): qty 125

## 2021-04-23 MED ORDER — FOLIC ACID 1 MG PO TABS
1.0000 mg | ORAL_TABLET | Freq: Every day | ORAL | Status: DC
  Administered 2021-04-23 – 2021-04-28 (×6): 1 mg via ORAL
  Filled 2021-04-23 (×6): qty 1

## 2021-04-23 MED ORDER — ONDANSETRON HCL 4 MG PO TABS: 4.0000 mg | ORAL_TABLET | Freq: Four times a day (QID) | ORAL | Status: DC | PRN

## 2021-04-23 MED ORDER — SODIUM CHLORIDE 0.9% FLUSH: 3.0000 mL | INTRAVENOUS | Status: DC | PRN

## 2021-04-23 MED ORDER — PANTOPRAZOLE SODIUM 40 MG PO TBEC
40.0000 mg | DELAYED_RELEASE_TABLET | Freq: Every day | ORAL | Status: DC
  Administered 2021-04-24 – 2021-04-28 (×5): 40 mg via ORAL
  Filled 2021-04-23 (×5): qty 1

## 2021-04-23 MED ORDER — LORAZEPAM 2 MG/ML IJ SOLN: 1.0000 mg | INTRAMUSCULAR | Status: AC | PRN

## 2021-04-23 MED ORDER — METOPROLOL TARTRATE 5 MG/5ML IV SOLN
2.5000 mg | INTRAVENOUS | Status: AC
  Administered 2021-04-23 – 2021-04-24 (×2): 2.5 mg via INTRAVENOUS
  Filled 2021-04-23 (×2): qty 5

## 2021-04-23 MED ORDER — SODIUM CHLORIDE 0.9 % IV SOLN
250.0000 mL | INTRAVENOUS | Status: DC | PRN
  Administered 2021-04-23: 250 mL via INTRAVENOUS

## 2021-04-23 MED ORDER — RIVAROXABAN 20 MG PO TABS
20.0000 mg | ORAL_TABLET | Freq: Every day | ORAL | Status: DC
  Administered 2021-04-23 – 2021-04-28 (×6): 20 mg via ORAL
  Filled 2021-04-23 (×6): qty 1

## 2021-04-23 MED ORDER — ONDANSETRON HCL 4 MG/2ML IJ SOLN: 4.0000 mg | Freq: Four times a day (QID) | INTRAMUSCULAR | Status: DC | PRN

## 2021-04-23 MED ORDER — FUROSEMIDE 10 MG/ML IJ SOLN
20.0000 mg | Freq: Once | INTRAMUSCULAR | Status: AC
  Administered 2021-04-23: 20 mg via INTRAVENOUS
  Filled 2021-04-23: qty 2

## 2021-04-23 MED ORDER — SODIUM CHLORIDE 0.9 % IV BOLUS
250.0000 mL | Freq: Once | INTRAVENOUS | Status: AC
  Administered 2021-04-23: 250 mL via INTRAVENOUS

## 2021-04-23 MED ORDER — DILTIAZEM HCL 25 MG/5ML IV SOLN
10.0000 mg | Freq: Once | INTRAVENOUS | Status: AC
  Administered 2021-04-23: 10 mg via INTRAVENOUS
  Filled 2021-04-23: qty 5

## 2021-04-23 MED ORDER — ADULT MULTIVITAMIN W/MINERALS CH
1.0000 | ORAL_TABLET | Freq: Every day | ORAL | Status: DC
  Administered 2021-04-23 – 2021-04-28 (×6): 1 via ORAL
  Filled 2021-04-23 (×6): qty 1

## 2021-04-23 MED ORDER — THIAMINE HCL 100 MG PO TABS
100.0000 mg | ORAL_TABLET | Freq: Every day | ORAL | Status: DC
  Administered 2021-04-23 – 2021-04-28 (×6): 100 mg via ORAL
  Filled 2021-04-23 (×6): qty 1

## 2021-04-23 MED ORDER — SODIUM CHLORIDE 3 % IV SOLN
INTRAVENOUS | Status: DC
  Filled 2021-04-23 (×3): qty 500

## 2021-04-23 MED ORDER — SODIUM CHLORIDE 0.9 % IV BOLUS
500.0000 mL | Freq: Once | INTRAVENOUS | Status: DC
Start: 2021-04-23 — End: 2021-04-23

## 2021-04-23 MED ORDER — ACETAMINOPHEN 650 MG RE SUPP: 650.0000 mg | Freq: Four times a day (QID) | RECTAL | Status: DC | PRN

## 2021-04-23 MED ORDER — METOPROLOL TARTRATE 5 MG/5ML IV SOLN: 2.5000 mg | INTRAVENOUS | Status: DC

## 2021-04-23 MED ORDER — AMIODARONE HCL IN DEXTROSE 360-4.14 MG/200ML-% IV SOLN: 30.0000 mg/h | INTRAVENOUS | Status: DC

## 2021-04-23 MED ORDER — METOPROLOL TARTRATE 5 MG/5ML IV SOLN
2.5000 mg | INTRAVENOUS | Status: AC
  Filled 2021-04-23: qty 5

## 2021-04-23 MED ORDER — CHLORDIAZEPOXIDE HCL 5 MG PO CAPS
10.0000 mg | ORAL_CAPSULE | Freq: Four times a day (QID) | ORAL | Status: AC
  Administered 2021-04-23 (×2): 10 mg via ORAL
  Filled 2021-04-23 (×2): qty 2

## 2021-04-23 MED ORDER — ACETAMINOPHEN 325 MG PO TABS
650.0000 mg | ORAL_TABLET | Freq: Four times a day (QID) | ORAL | Status: DC | PRN
  Administered 2021-04-24: 650 mg via ORAL
  Filled 2021-04-23: qty 2

## 2021-04-23 MED ORDER — AMIODARONE HCL IN DEXTROSE 360-4.14 MG/200ML-% IV SOLN
60.0000 mg/h | INTRAVENOUS | Status: DC
  Administered 2021-04-23: 60 mg/h via INTRAVENOUS
  Filled 2021-04-23: qty 200

## 2021-04-23 NOTE — ED Notes (Signed)
Pt c/o of feeling hot.  No increase in SOB or CP, pt's sats 83-84 %, O2 increased to 4L/M with no improvement.  Increased to 6 L/M.  Dr. Joesph Fillers instructed to stop Amiodarone drip and start bolus.  Bolus started and then instructed to stop bolus. Pt's sats with no improvement.  Pt placed on NRB.
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
## 2021-04-23 NOTE — ED Triage Notes (Signed)
SOB that began this AM that awoke him from his sleep. Was seen by cardiologist yesterday and pt states he had plans to cardiovert pt next week. CP that is generalized, tightness that does not radiate.
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
## 2021-04-23 NOTE — Progress Notes (Signed)
  Please STOP 3% saline when serum Sodium gets into the low 120s -

## 2021-04-23 NOTE — Progress Notes (Signed)
*  PRELIMINARY RESULTS* Echocardiogram 2D Echocardiogram has been performed.  Bryan Pace 04/23/2021, 3:08 PM

## 2021-04-23 NOTE — ED Notes (Signed)
RA sats 89-90%, applied [place] at 2 L/M
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
## 2021-04-24 ENCOUNTER — Encounter (HOSPITAL_COMMUNITY): Payer: Self-pay | Admitting: Certified Registered Nurse Anesthetist

## 2021-04-24 ENCOUNTER — Other Ambulatory Visit (HOSPITAL_COMMUNITY): Payer: BC Managed Care – PPO

## 2021-04-24 ENCOUNTER — Observation Stay (HOSPITAL_COMMUNITY): Payer: BC Managed Care – PPO

## 2021-04-24 ENCOUNTER — Encounter (HOSPITAL_COMMUNITY)
Admission: EM | Disposition: A | Discharge status: Home / Self Care | Payer: Self-pay | Source: Home / Self Care | Attending: Internal Medicine

## 2021-04-24 DIAGNOSIS — F1010 Alcohol abuse, uncomplicated: Secondary | ICD-10-CM | POA: Diagnosis present

## 2021-04-24 DIAGNOSIS — I4892 Unspecified atrial flutter: Secondary | ICD-10-CM | POA: Diagnosis not present

## 2021-04-24 DIAGNOSIS — I081 Rheumatic disorders of both mitral and tricuspid valves: Secondary | ICD-10-CM | POA: Diagnosis present

## 2021-04-24 DIAGNOSIS — R195 Other fecal abnormalities: Secondary | ICD-10-CM | POA: Diagnosis present

## 2021-04-24 DIAGNOSIS — Z20822 Contact with and (suspected) exposure to covid-19: Secondary | ICD-10-CM | POA: Diagnosis present

## 2021-04-24 DIAGNOSIS — K219 Gastro-esophageal reflux disease without esophagitis: Secondary | ICD-10-CM | POA: Diagnosis present

## 2021-04-24 DIAGNOSIS — I429 Cardiomyopathy, unspecified: Secondary | ICD-10-CM | POA: Diagnosis present

## 2021-04-24 DIAGNOSIS — R0600 Dyspnea, unspecified: Secondary | ICD-10-CM | POA: Diagnosis not present

## 2021-04-24 DIAGNOSIS — I10 Essential (primary) hypertension: Secondary | ICD-10-CM | POA: Diagnosis not present

## 2021-04-24 DIAGNOSIS — J90 Pleural effusion, not elsewhere classified: Secondary | ICD-10-CM | POA: Diagnosis not present

## 2021-04-24 DIAGNOSIS — I5021 Acute systolic (congestive) heart failure: Secondary | ICD-10-CM

## 2021-04-24 DIAGNOSIS — I959 Hypotension, unspecified: Secondary | ICD-10-CM | POA: Diagnosis present

## 2021-04-24 DIAGNOSIS — Z23 Encounter for immunization: Secondary | ICD-10-CM | POA: Diagnosis not present

## 2021-04-24 DIAGNOSIS — I483 Typical atrial flutter: Secondary | ICD-10-CM | POA: Diagnosis present

## 2021-04-24 DIAGNOSIS — E871 Hypo-osmolality and hyponatremia: Secondary | ICD-10-CM | POA: Diagnosis not present

## 2021-04-24 DIAGNOSIS — J9601 Acute respiratory failure with hypoxia: Secondary | ICD-10-CM | POA: Diagnosis present

## 2021-04-24 DIAGNOSIS — I340 Nonrheumatic mitral (valve) insufficiency: Secondary | ICD-10-CM | POA: Diagnosis not present

## 2021-04-24 DIAGNOSIS — Z720 Tobacco use: Secondary | ICD-10-CM | POA: Diagnosis not present

## 2021-04-24 DIAGNOSIS — Z7901 Long term (current) use of anticoagulants: Secondary | ICD-10-CM | POA: Diagnosis not present

## 2021-04-24 DIAGNOSIS — R0602 Shortness of breath: Secondary | ICD-10-CM | POA: Diagnosis present

## 2021-04-24 DIAGNOSIS — I4819 Other persistent atrial fibrillation: Secondary | ICD-10-CM | POA: Diagnosis present

## 2021-04-24 DIAGNOSIS — F17210 Nicotine dependence, cigarettes, uncomplicated: Secondary | ICD-10-CM | POA: Diagnosis present

## 2021-04-24 DIAGNOSIS — I110 Hypertensive heart disease with heart failure: Secondary | ICD-10-CM | POA: Diagnosis present

## 2021-04-24 DIAGNOSIS — Z79899 Other long term (current) drug therapy: Secondary | ICD-10-CM | POA: Diagnosis not present

## 2021-04-24 DIAGNOSIS — D509 Iron deficiency anemia, unspecified: Secondary | ICD-10-CM | POA: Diagnosis present

## 2021-04-24 DIAGNOSIS — E876 Hypokalemia: Secondary | ICD-10-CM | POA: Diagnosis present

## 2021-04-24 LAB — CBC
HCT: 32.4 % — ABNORMAL LOW (ref 39.0–52.0)
Hemoglobin: 11.3 g/dL — ABNORMAL LOW (ref 13.0–17.0)
MCH: 29.7 pg (ref 26.0–34.0)
MCHC: 34.9 g/dL (ref 30.0–36.0)
MCV: 85.3 fL (ref 80.0–100.0)
Platelets: 306 10*3/uL (ref 150–400)
RBC: 3.8 MIL/uL — ABNORMAL LOW (ref 4.22–5.81)
RDW: 13.9 % (ref 11.5–15.5)
WBC: 8 10*3/uL (ref 4.0–10.5)
nRBC: 0 % (ref 0.0–0.2)

## 2021-04-24 LAB — BASIC METABOLIC PANEL
Anion gap: 8 (ref 5–15)
Anion gap: 9 (ref 5–15)
BUN: 10 mg/dL (ref 6–20)
BUN: 12 mg/dL (ref 6–20)
CO2: 20 mmol/L — ABNORMAL LOW (ref 22–32)
CO2: 23 mmol/L (ref 22–32)
Calcium: 8 mg/dL — ABNORMAL LOW (ref 8.9–10.3)
Calcium: 8.3 mg/dL — ABNORMAL LOW (ref 8.9–10.3)
Chloride: 90 mmol/L — ABNORMAL LOW (ref 98–111)
Chloride: 93 mmol/L — ABNORMAL LOW (ref 98–111)
Creatinine, Ser: 0.87 mg/dL (ref 0.61–1.24)
Creatinine, Ser: 1.11 mg/dL (ref 0.61–1.24)
GFR, Estimated: >60 mL/min (ref >60)
GFR, Estimated: >60 mL/min (ref >60)
Glucose, Bld: 100 mg/dL — ABNORMAL HIGH (ref 70–99)
Glucose, Bld: 129 mg/dL — ABNORMAL HIGH (ref 70–99)
Potassium: 3.6 mmol/L (ref 3.5–5.1)
Potassium: 3.1 mmol/L — ABNORMAL LOW (ref 3.5–5.1)
Sodium: 118 mmol/L — CL (ref 135–145)
Sodium: 125 mmol/L — ABNORMAL LOW (ref 135–145)

## 2021-04-24 LAB — SODIUM, URINE, RANDOM
Sodium, Ur: 36 mmol/L
Sodium, Ur: 38 mmol/L

## 2021-04-24 LAB — TSH: TSH: 1.39 u[IU]/mL (ref 0.350–4.500)

## 2021-04-24 LAB — OSMOLALITY, URINE
Osmolality, Ur: 311 mOsm/kg (ref 300–900)
Osmolality, Ur: 144 mOsm/kg — ABNORMAL LOW (ref 300–900)

## 2021-04-24 LAB — VITAMIN B12: Vitamin B-12: 217 pg/mL (ref 180–914)

## 2021-04-24 LAB — SODIUM
Sodium: 119 mmol/L — CL (ref 135–145)
Sodium: 121 mmol/L — ABNORMAL LOW (ref 135–145)

## 2021-04-24 LAB — OSMOLALITY
Osmolality: 255 mOsm/kg — ABNORMAL LOW (ref 275–295)
Osmolality: 250 mOsm/kg — ABNORMAL LOW (ref 275–295)

## 2021-04-24 LAB — MRSA NEXT GEN BY PCR, NASAL: MRSA by PCR Next Gen: NOT DETECTED

## 2021-04-24 LAB — CREATININE, URINE, RANDOM: Creatinine, Urine: 19.5 mg/dL

## 2021-04-24 LAB — FOLATE: Folate: 17.9 ng/mL (ref >5.9)

## 2021-04-24 LAB — CORTISOL-AM, BLOOD: Cortisol - AM: 27.7 ug/dL — ABNORMAL HIGH (ref 6.7–22.6)

## 2021-04-24 SURGERY — CARDIOVERSION
Anesthesia: Monitor Anesthesia Care

## 2021-04-24 MED ORDER — CYANOCOBALAMIN 1000 MCG/ML IJ SOLN
1000.0000 ug | Freq: Once | INTRAMUSCULAR | Status: AC
  Administered 2021-04-24: 1000 ug via INTRAMUSCULAR
  Filled 2021-04-24: qty 1

## 2021-04-24 MED ORDER — DIPHENHYDRAMINE HCL 25 MG PO CAPS
25.0000 mg | ORAL_CAPSULE | Freq: Once | ORAL | Status: AC
  Administered 2021-04-24: 25 mg via ORAL
  Filled 2021-04-24: qty 1

## 2021-04-24 MED ORDER — FUROSEMIDE 10 MG/ML IJ SOLN
40.0000 mg | Freq: Two times a day (BID) | INTRAMUSCULAR | Status: DC
  Administered 2021-04-24 – 2021-04-25 (×3): 40 mg via INTRAVENOUS
  Filled 2021-04-24 (×3): qty 4

## 2021-04-24 MED ORDER — CHLORHEXIDINE GLUCONATE CLOTH 2 % EX PADS
6.0000 | MEDICATED_PAD | Freq: Every day | CUTANEOUS | Status: DC
Start: 2021-04-24 — End: 2021-04-27
  Administered 2021-04-24 – 2021-04-26 (×3): 6 via TOPICAL

## 2021-04-24 MED ORDER — FERROUS SULFATE 325 (65 FE) MG PO TABS
325.0000 mg | ORAL_TABLET | Freq: Every day | ORAL | Status: DC
  Administered 2021-04-25 – 2021-04-27 (×3): 325 mg via ORAL
  Filled 2021-04-24 (×3): qty 1

## 2021-04-24 MED ORDER — INFLUENZA VAC SPLIT QUAD 0.5 ML IM SUSY
0.5000 mL | PREFILLED_SYRINGE | INTRAMUSCULAR | Status: AC
  Administered 2021-04-25: 0.5 mL via INTRAMUSCULAR
  Filled 2021-04-24: qty 0.5

## 2021-04-24 MED ORDER — SODIUM CHLORIDE 0.9 % IV SOLN
250.0000 mg | Freq: Once | INTRAVENOUS | Status: AC
  Administered 2021-04-24: 250 mg via INTRAVENOUS
  Filled 2021-04-24: qty 20

## 2021-04-24 NOTE — Progress Notes (Addendum)
PROGRESS NOTE  Bryan Pace UEK:[id number] DOB: 09-30-1965 DOA: 04/23/2021 PCP: Celene Squibb, MD  Brief History:  55 year old male with a history of hypertension, alcohol abuse, tobacco abuse, GERD presenting with chest discomfort, shortness of breath, and dizziness of 1 day duration.  Notably, the patient saw his primary care provider who diagnosed him with atrial flutter and started him on metoprolol and Xarelto on 04/14/2021.  He saw Dr. Harl Bowie on 04/18/2021.  He had a nurse visit on 04/22/2021.  His metoprolol was increased to 75 mg twice daily and his irbesartan was discontinued secondary to soft blood pressure.  Plans were made for TEE cardioversion.  However, he began developing symptoms of chest discomfort and shortness of breath on 04/23/2021.  He states that he drinks about 10-12 beers per week.  He denies any liquor or wine.  He has a 30-pack-year history of tobacco but quit smoking 7 months prior to this admission.  He denies any fevers, chills, headache, neck pain, nausea, vomiting, diarrhea, abdominal pain, dysuria, hematuria. In the emergency department, the patient was afebrile with soft blood pressures.  His heart rate was 141.  The patient was started on a diltiazem drip initially.  He was seen by cardiology.  There is concern for fluid overload.  He was also noted to have a sodium of 115 at time of admission.  He was started on 3% hypertonic saline.  Assessment/Plan: Symptomatic atrial flutter with RVR -Appreciate cardiology evaluation -Holding TEE cardioversion today due to hyponatremia -04/23/2021 echo EF 20%, global HK, PASP 51, moderate TR, mild to moderate MR -TSH 1.390 -Case discussed with cardiology, Dr. Domenic Polite -did not tolerate amiodarone (flushing and hypoxia) -continue diltiazem short term  Hyponatremia -Etiology unclear although suspect a degree of fluid overload -d/c hypertonic saline -IV Lasix initiated by cardiology -Discontinue hypertonic  saline -Urine Na -urine creatinine -serum osm -urine osm -may certainly have a component of potomania and polydipsia per patient hx (6 x 20 ounce bottles water daily) -his HCTZ was stopped on 04/18/21  Acute respiratory failure with hypoxia -moved to ICU 10/19 evening  -had saturation 82% on RA with tachypnea RR 31 -personally reviewed CXR--increased interstitial markings, small pleural effusions -IV lasix as discussed with cardiology  Acute systolic CHF -15/11/6977 echo EF 20%, global HK -IV Lasix initiated by cardiology  Alcohol abuse -Alcohol withdrawal protocol -Continue thiamine  Tobacco abuse in remission -Quit tobacco 7 months ago  Iron deficiency anemia -Iron saturation 4%, ferritin 15 -Start nulecit -give B12 injection as B12 low normal  Essential hypertension -Holding irbesartan--previously discontinued       Status is: Observation  The patient will require care spanning > 2 midnights and should be moved to inpatient because: Remains on IV diltiazem drip with low Na.       Family Communication:   no Family at bedside  Consultants:  cardiology, renal  Code Status:  FULL  DVT Prophylaxis:  xarelto   Procedures: As Listed in Progress Note Above  Antibiotics: None      Subjective: Patient denies fevers, chills, headache, chest pain, dyspnea, nausea, vomiting, diarrhea, abdominal pain, dysuria, hematuria, hematochezia, and melena.   Objective: Vitals:   04/24/21 0700 04/24/21 0730 04/24/21 0800 04/24/21 0830  BP: 112/70 97/67 125/73 107/78  Pulse: (!) 44 73 71 68  Resp: (!) 34 20 (!) 21 (!) 25  Temp:      TempSrc:      SpO2: 100% 98% 98% 98%  Weight:      Height:        Intake/Output Summary (Last 24 hours) at 04/24/2021 0857 Last data filed at 04/24/2021 0300 Gross per 24 hour  Intake 696.99 ml  Output 1250 ml  Net -553.01 ml   Weight change:  Exam:  General:  Pt is alert, follows commands appropriately, not in acute  distress HEENT: No icterus, No thrush, No neck mass, [place]/AT Cardiovascular: IRRR, S1/S2, no rubs, no gallops Respiratory: fine bibasilar crackles. No wheeze Abdomen: Soft/+BS, non tender, non distended, no guarding Extremities: No edema, No lymphangitis, No petechiae, No rashes, no synovitis   Data Reviewed: I have personally reviewed following labs and imaging studies Basic Metabolic Panel: Recent Labs  Lab 04/23/21 1154 04/23/21 1731 04/23/21 1738 04/23/21 2038 04/23/21 2300 04/24/21 0309  NA 119*  --  115* 114* 116* 118*  119*  K 4.0  --  5.0  --   --  3.6  CL 87*  --  85*  --   --  90*  CO2 23  --  20*  --   --  20*  GLUCOSE 115*  --  132*  --   --  100*  BUN 10  --  11  --   --  10  CREATININE 0.91  --  0.91  --   --  0.87  CALCIUM 8.8*  --  8.2*  --   --  8.0*  MG  --  1.5*  --   --   --   --    Liver Function Tests: Recent Labs  Lab 04/23/21 1154  AST 18  ALT 13  ALKPHOS 84  BILITOT 0.8  PROT 7.4  ALBUMIN 4.1   No results for input(s): LIPASE, AMYLASE in the last 168 hours. No results for input(s): AMMONIA in the last 168 hours. Coagulation Profile: No results for input(s): INR, PROTIME in the last 168 hours. CBC: Recent Labs  Lab 04/23/21 1154 04/24/21 0309  WBC 5.2 8.0  HGB 12.4* 11.3*  HCT 36.7* 32.4*  MCV 85.7 85.3  PLT 351 306   Cardiac Enzymes: No results for input(s): CKTOTAL, CKMB, CKMBINDEX, TROPONINI in the last 168 hours. BNP: Invalid input(s): POCBNP CBG: No results for input(s): GLUCAP in the last 168 hours. HbA1C: No results for input(s): HGBA1C in the last 72 hours. Urine analysis:    Component Value Date/Time   COLORURINE YELLOW 09/30/2012 1209   APPEARANCEUR CLEAR 09/30/2012 1209   LABSPEC <1.005 (L) 09/30/2012 1209   PHURINE 7.0 09/30/2012 1209   GLUCOSEU NEGATIVE 09/30/2012 1209   HGBUR NEGATIVE 09/30/2012 1209   BILIRUBINUR NEGATIVE 09/30/2012 1209   KETONESUR >80 (A) 09/30/2012 1209   PROTEINUR NEGATIVE 09/30/2012  1209   UROBILINOGEN 0.2 09/30/2012 1209   NITRITE NEGATIVE 09/30/2012 1209   LEUKOCYTESUR NEGATIVE 09/30/2012 1209   Sepsis Labs: @LABRCNTIP (procalcitonin:4,lacticidven:4) ) Recent Results (from the past 240 hour(s))  Resp Panel by RT-PCR (Flu A&B, Covid) Nasopharyngeal Swab     Status: None   Collection Time: 04/23/21  1:17 PM   Specimen: Nasopharyngeal Swab; Nasopharyngeal(NP) swabs in vial transport medium  Result Value Ref Range Status   SARS Coronavirus 2 by RT PCR NEGATIVE NEGATIVE Final    Comment: (NOTE) SARS-CoV-2 target nucleic acids are NOT DETECTED.  The SARS-CoV-2 RNA is generally detectable in upper respiratory specimens during the acute phase of infection. The lowest concentration of SARS-CoV-2 viral copies this assay can detect is 138 copies/mL. A negative result does not preclude SARS-Cov-2  infection and should not be used as the sole basis for treatment or other patient management decisions. A negative result may occur with  improper specimen collection/handling, submission of specimen other than nasopharyngeal swab, presence of viral mutation(s) within the areas targeted by this assay, and inadequate number of viral copies(<138 copies/mL). A negative result must be combined with clinical observations, patient history, and epidemiological information. The expected result is Negative.  Fact Sheet for Patients:  EntrepreneurPulse.com.au  Fact Sheet for Healthcare Providers:  IncredibleEmployment.be  This test is no t yet approved or cleared by the [place] FDA and  has been authorized for detection and/or diagnosis of SARS-CoV-2 by FDA under an Emergency Use Authorization (EUA). This EUA will remain  in effect (meaning this test can be used) for the duration of the COVID-19 declaration under Section 564(b)(1) of the Act, 21 U.S.C.section 360bbb-3(b)(1), unless the authorization is terminated  or revoked sooner.        Influenza A by PCR NEGATIVE NEGATIVE Final   Influenza B by PCR NEGATIVE NEGATIVE Final    Comment: (NOTE) The Xpert Xpress SARS-CoV-2/FLU/RSV plus assay is intended as an aid in the diagnosis of influenza from Nasopharyngeal swab specimens and should not be used as a sole basis for treatment. Nasal washings and aspirates are unacceptable for Xpert Xpress SARS-CoV-2/FLU/RSV testing.  Fact Sheet for Patients: EntrepreneurPulse.com.au  Fact Sheet for Healthcare Providers: IncredibleEmployment.be  This test is not yet approved or cleared by the [place] FDA and has been authorized for detection and/or diagnosis of SARS-CoV-2 by FDA under an Emergency Use Authorization (EUA). This EUA will remain in effect (meaning this test can be used) for the duration of the COVID-19 declaration under Section 564(b)(1) of the Act, 21 U.S.C. section 360bbb-3(b)(1), unless the authorization is terminated or revoked.  Performed at [facility], [address] [place],  [address]      Scheduled Meds:  Chlorhexidine Gluconate Cloth  6 each Topical Daily   folic acid  1 mg Oral Daily   [START ON 04/25/2021] influenza vac split quadrivalent PF  0.5 mL Intramuscular Tomorrow-1000   multivitamin with minerals  1 tablet Oral Daily   pantoprazole  40 mg Oral Daily   rivaroxaban  20 mg Oral Q supper   sodium chloride flush  3 mL Intravenous Q12H   sodium chloride flush  3 mL Intravenous Q12H   thiamine  100 mg Oral Daily   Or   thiamine  100 mg Intravenous Daily   Continuous Infusions:  sodium chloride 250 mL (04/23/21 2118)   diltiazem (CARDIZEM) infusion 2.5 mg/hr (04/23/21 2000)   sodium chloride (hypertonic) 20 mL/hr at 04/23/21 2109    Procedures/Studies: DG Chest 2 View  Result Date: 04/24/2021 CLINICAL DATA:  Dyspnea EXAM: CHEST - 2 VIEW COMPARISON:  04/23/2021 FINDINGS: Stable cardiomediastinal contours. Increasing hazy opacity at the  right lung base. Small layering bilateral pleural effusions. No pneumothorax. IMPRESSION: Small bilateral pleural effusions with increasing hazy opacity at the right lung base, atelectasis versus pneumonia. Electronically Signed   By: Davina Poke D.O.   On: 04/24/2021 07:39   DG Chest Port 1 View  Result Date: 04/23/2021 CLINICAL DATA:  Shortness of breath EXAM: PORTABLE CHEST 1 VIEW COMPARISON:  Chest x-ray dated August 31, 2009 FINDINGS: Cardiac and mediastinal contours are within normal limits for AP technique. Mild bilateral interstitial opacities. No focal consolidation. No large pleural effusion or pneumothorax. IMPRESSION: Mild bilateral interstitial opacities, possibly due to pulmonary edema. Electronically Signed  By: Yetta Glassman M.D.   On: 04/23/2021 12:36   DG Chest Port 1V same Day  Result Date: 04/23/2021 CLINICAL DATA:  Shortness of breath and chest tightness. EXAM: PORTABLE CHEST 1 VIEW COMPARISON:  Chest x-ray 04/23/2021. FINDINGS: Heart size is upper limits of normal, unchanged. There is no focal lung consolidation, pleural effusion or pneumothorax. No acute fractures are seen. IMPRESSION: No active disease. Electronically Signed   By: Ronney Asters M.D.   On: 04/23/2021 19:37   ECHOCARDIOGRAM COMPLETE  Result Date: 04/23/2021    ECHOCARDIOGRAM REPORT   Patient Name:   Bryan Pace Date of Exam: 04/23/2021 Medical Rec #:  [id number]      Height:       72.0 in Accession #:    [id number]     Weight:       175.0 lb Date of Birth:  August 23, 1965     BSA:          2.013 m Patient Age:    45 years       BP:           109/98 mmHg Patient Gender: M              HR:           112 bpm. Exam Location:  [facility] Procedure: 2D Echo, Cardiac Doppler and Color Doppler STAT ECHO Indications:    Abnormal ECG R94.31  History:        Patient has no prior history of Echocardiogram examinations.                 Arrythmias:Atrial Flutter; Risk Factors:Hypertension.  Sonographer:    Alvino Chapel RCS Referring Phys: [phone] COURAGE EMOKPAE IMPRESSIONS  1. Left ventricular ejection fraction, by estimation, is approximately 20% in the setting of atrial flutter. The left ventricle has severely decreased function. The left ventricle demonstrates global hypokinesis. The left ventricular internal cavity size was mildly dilated. There is mild left ventricular hypertrophy. Left ventricular diastolic parameters are indeterminate.  2. Right ventricular systolic function is mildly reduced. The right ventricular size is normal. There is moderately elevated pulmonary artery systolic pressure. The estimated right ventricular systolic pressure is 16.3 mmHg.  3. Left atrial size was moderately dilated.  4. Right atrial size was mildly dilated.  5. The mitral valve is grossly normal. Mild to moderate mitral valve regurgitation.  6. Tricuspid valve regurgitation is moderate.  7. The aortic valve is tricuspid. Aortic valve regurgitation is not visualized.  8. The inferior vena cava is dilated in size with <50% respiratory variability, suggesting right atrial pressure of 15 mmHg. Comparison(s): No prior Echocardiogram. FINDINGS  Left Ventricle: Left ventricular ejection fraction, by estimation, is 20%. The left ventricle has severely decreased function. The left ventricle demonstrates global hypokinesis. The left ventricular internal cavity size was mildly dilated. There is mild left ventricular hypertrophy. Left ventricular diastolic parameters are indeterminate. Right Ventricle: The right ventricular size is normal. No increase in right ventricular wall thickness. Right ventricular systolic function is mildly reduced. There is moderately elevated pulmonary artery systolic pressure. The tricuspid regurgitant velocity is 3.00 m/s, and with an assumed right atrial pressure of 15 mmHg, the estimated right ventricular systolic pressure is 84.6 mmHg. Left Atrium: Left atrial size was moderately dilated. Right Atrium: Right  atrial size was mildly dilated. Pericardium: There is no evidence of pericardial effusion. Presence of pericardial fat pad. Mitral Valve: The mitral valve is grossly normal. There is mild thickening of the  mitral valve leaflet(s). Mild to moderate mitral valve regurgitation. Tricuspid Valve: The tricuspid valve is grossly normal. Tricuspid valve regurgitation is moderate. Aortic Valve: The aortic valve is tricuspid. There is mild aortic valve annular calcification. Aortic valve regurgitation is not visualized. Pulmonic Valve: The pulmonic valve was grossly normal. Pulmonic valve regurgitation is trivial. Aorta: The aortic root is normal in size and structure. Venous: The inferior vena cava is dilated in size with less than 50% respiratory variability, suggesting right atrial pressure of 15 mmHg. IAS/Shunts: No atrial level shunt detected by color flow Doppler.  LEFT VENTRICLE PLAX 2D LVIDd:         6.00 cm LVIDs:         5.20 cm LV PW:         1.10 cm LV IVS:        1.00 cm LVOT diam:     2.00 cm LV SV:         22 LV SV Index:   11 LVOT Area:     3.14 cm  RIGHT VENTRICLE TAPSE (M-mode): 1.4 cm LEFT ATRIUM              Index        RIGHT ATRIUM           Index LA diam:        5.00 cm  2.48 cm/m   RA Area:     21.10 cm LA Vol (A2C):   102.0 ml 50.67 ml/m  RA Volume:   64.10 ml  31.84 ml/m LA Vol (A4C):   93.0 ml  46.20 ml/m LA Biplane Vol: 101.0 ml 50.17 ml/m  AORTIC VALVE LVOT Vmax:   47.17 cm/s LVOT Vmean:  36.733 cm/s LVOT VTI:    0.070 m  AORTA Ao Root diam: 3.60 cm MITRAL VALVE               TRICUSPID VALVE MV Area (PHT): 5.02 cm    TR Peak grad:   36.0 mmHg MV Decel Time: 151 msec    TR Vmax:        300.00 cm/s MV E velocity: 97.70 cm/s                            SHUNTS                            Systemic VTI:  0.07 m                            Systemic Diam: 2.00 cm Rozann Lesches MD Electronically signed by Rozann Lesches MD Signature Date/Time: 04/23/2021/3:59:06 PM    Final     Orson Eva,  DO  [facility]  If 7PM-7AM, please contact night-coverage [email or web address] Password [email or web address] 04/24/2021, 8:57 AM   LOS: 0 days

## 2021-04-24 NOTE — Progress Notes (Signed)
Patient currently inpatient at [facility].
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
## 2021-04-24 NOTE — Progress Notes (Signed)
Progress Note  Patient Name: Bryan Pace Date of Encounter: 04/24/2021  Primary Cardiologist: Carlyle Dolly, MD  Subjective   Shortness of breath somewhat better.  No chest pain or sense of palpitations.  Feels weak.  Inpatient Medications    Scheduled Meds:  Chlorhexidine Gluconate Cloth  6 each Topical Daily   folic acid  1 mg Oral Daily   [START ON 04/25/2021] influenza vac split quadrivalent PF  0.5 mL Intramuscular Tomorrow-1000   multivitamin with minerals  1 tablet Oral Daily   pantoprazole  40 mg Oral Daily   rivaroxaban  20 mg Oral Q supper   sodium chloride flush  3 mL Intravenous Q12H   sodium chloride flush  3 mL Intravenous Q12H   thiamine  100 mg Oral Daily   Or   thiamine  100 mg Intravenous Daily   Continuous Infusions:  sodium chloride 250 mL (04/23/21 2118)   diltiazem (CARDIZEM) infusion 2.5 mg/hr (04/23/21 2000)   sodium chloride (hypertonic) 20 mL/hr at 04/23/21 2109   PRN Meds: sodium chloride, acetaminophen **OR** acetaminophen, bisacodyl, LORazepam **OR** LORazepam, ondansetron **OR** ondansetron (ZOFRAN) IV, polyethylene glycol, sodium chloride flush   Vital Signs    Vitals:   04/24/21 0700 04/24/21 0730 04/24/21 0800 04/24/21 0830  BP: 112/70 97/67 125/73 107/78  Pulse: (!) 44 73 71 68  Resp: (!) 34 20 (!) 21 (!) 25  Temp:      TempSrc:      SpO2: 100% 98% 98% 98%  Weight:      Height:        Intake/Output Summary (Last 24 hours) at 04/24/2021 0853 Last data filed at 04/24/2021 0300 Gross per 24 hour  Intake 696.99 ml  Output 1250 ml  Net -553.01 ml   Filed Weights   04/23/21 1141 04/23/21 2016 04/24/21 0500  Weight: 79.4 kg 79.7 kg 78 kg    Telemetry    Atrial flutter with variable conduction.  Personally reviewed.  ECG    An ECG dated 04/24/2021 was personally reviewed today and demonstrated:  Atrial flutter with left posterior fascicular block and variable conduction.  Physical Exam   GEN: No acute distress.    Neck: No JVD. Cardiac: Irregularly irregular without gallop.  Respiratory: Nonlabored.  Few crackles at the bases. GI: Soft, nontender, bowel sounds present. MS: No edema; No deformity. Neuro:  Nonfocal. Psych: Alert and oriented x 3. Normal affect.  Labs    Chemistry Recent Labs  Lab 04/23/21 1154 04/23/21 1738 04/23/21 2038 04/23/21 2300 04/24/21 0309  NA 119* 115* 114* 116* 118*  119*  K 4.0 5.0  --   --  3.6  CL 87* 85*  --   --  90*  CO2 23 20*  --   --  20*  GLUCOSE 115* 132*  --   --  100*  BUN 10 11  --   --  10  CREATININE 0.91 0.91  --   --  0.87  CALCIUM 8.8* 8.2*  --   --  8.0*  PROT 7.4  --   --   --   --   ALBUMIN 4.1  --   --   --   --   AST 18  --   --   --   --   ALT 13  --   --   --   --   ALKPHOS 84  --   --   --   --   BILITOT 0.8  --   --   --   --  GFRNONAA >60 >60  --   --  >60  ANIONGAP 9 10  --   --  8     Hematology Recent Labs  Lab 04/23/21 1154 04/24/21 0309  WBC 5.2 8.0  RBC 4.28 3.80*  HGB 12.4* 11.3*  HCT 36.7* 32.4*  MCV 85.7 85.3  MCH 29.0 29.7  MCHC 33.8 34.9  RDW 13.9 13.9  PLT 351 306    Cardiac Enzymes Recent Labs  Lab 04/23/21 1154 04/23/21 1331  TROPONINIHS 40* 36*    BNP Recent Labs  Lab 04/23/21 1154  BNP 521.0*     Radiology    DG Chest 2 View  Result Date: 04/24/2021 CLINICAL DATA:  Dyspnea EXAM: CHEST - 2 VIEW COMPARISON:  04/23/2021 FINDINGS: Stable cardiomediastinal contours. Increasing hazy opacity at the right lung base. Small layering bilateral pleural effusions. No pneumothorax. IMPRESSION: Small bilateral pleural effusions with increasing hazy opacity at the right lung base, atelectasis versus pneumonia. Electronically Signed   By: Davina Poke D.O.   On: 04/24/2021 07:39   DG Chest Port 1 View  Result Date: 04/23/2021 CLINICAL DATA:  Shortness of breath EXAM: PORTABLE CHEST 1 VIEW COMPARISON:  Chest x-ray dated August 31, 2009 FINDINGS: Cardiac and mediastinal contours are within  normal limits for AP technique. Mild bilateral interstitial opacities. No focal consolidation. No large pleural effusion or pneumothorax. IMPRESSION: Mild bilateral interstitial opacities, possibly due to pulmonary edema. Electronically Signed   By: Yetta Glassman M.D.   On: 04/23/2021 12:36   DG Chest Port 1V same Day  Result Date: 04/23/2021 CLINICAL DATA:  Shortness of breath and chest tightness. EXAM: PORTABLE CHEST 1 VIEW COMPARISON:  Chest x-ray 04/23/2021. FINDINGS: Heart size is upper limits of normal, unchanged. There is no focal lung consolidation, pleural effusion or pneumothorax. No acute fractures are seen. IMPRESSION: No active disease. Electronically Signed   By: Ronney Asters M.D.   On: 04/23/2021 19:37   ECHOCARDIOGRAM COMPLETE  Result Date: 04/23/2021    ECHOCARDIOGRAM REPORT   Patient Name:   Bryan Pace Date of Exam: 04/23/2021 Medical Rec #:  [id number]      Height:       72.0 in Accession #:    [id number]     Weight:       175.0 lb Date of Birth:  29-Jul-1965     BSA:          2.013 m Patient Age:    28 years       BP:           109/98 mmHg Patient Gender: M              HR:           112 bpm. Exam Location:  [facility] Procedure: 2D Echo, Cardiac Doppler and Color Doppler STAT ECHO Indications:    Abnormal ECG R94.31  History:        Patient has no prior history of Echocardiogram examinations.                 Arrythmias:Atrial Flutter; Risk Factors:Hypertension.  Sonographer:    Alvino Chapel RCS Referring Phys: [phone] COURAGE EMOKPAE IMPRESSIONS  1. Left ventricular ejection fraction, by estimation, is approximately 20% in the setting of atrial flutter. The left ventricle has severely decreased function. The left ventricle demonstrates global hypokinesis. The left ventricular internal cavity size was mildly dilated. There is mild left ventricular hypertrophy. Left ventricular diastolic parameters are indeterminate.  2. Right ventricular systolic function  is mildly reduced. The  right ventricular size is normal. There is moderately elevated pulmonary artery systolic pressure. The estimated right ventricular systolic pressure is 41.2 mmHg.  3. Left atrial size was moderately dilated.  4. Right atrial size was mildly dilated.  5. The mitral valve is grossly normal. Mild to moderate mitral valve regurgitation.  6. Tricuspid valve regurgitation is moderate.  7. The aortic valve is tricuspid. Aortic valve regurgitation is not visualized.  8. The inferior vena cava is dilated in size with <50% respiratory variability, suggesting right atrial pressure of 15 mmHg. Comparison(s): No prior Echocardiogram. FINDINGS  Left Ventricle: Left ventricular ejection fraction, by estimation, is 20%. The left ventricle has severely decreased function. The left ventricle demonstrates global hypokinesis. The left ventricular internal cavity size was mildly dilated. There is mild left ventricular hypertrophy. Left ventricular diastolic parameters are indeterminate. Right Ventricle: The right ventricular size is normal. No increase in right ventricular wall thickness. Right ventricular systolic function is mildly reduced. There is moderately elevated pulmonary artery systolic pressure. The tricuspid regurgitant velocity is 3.00 m/s, and with an assumed right atrial pressure of 15 mmHg, the estimated right ventricular systolic pressure is 87.8 mmHg. Left Atrium: Left atrial size was moderately dilated. Right Atrium: Right atrial size was mildly dilated. Pericardium: There is no evidence of pericardial effusion. Presence of pericardial fat pad. Mitral Valve: The mitral valve is grossly normal. There is mild thickening of the mitral valve leaflet(s). Mild to moderate mitral valve regurgitation. Tricuspid Valve: The tricuspid valve is grossly normal. Tricuspid valve regurgitation is moderate. Aortic Valve: The aortic valve is tricuspid. There is mild aortic valve annular calcification. Aortic valve regurgitation is not  visualized. Pulmonic Valve: The pulmonic valve was grossly normal. Pulmonic valve regurgitation is trivial. Aorta: The aortic root is normal in size and structure. Venous: The inferior vena cava is dilated in size with less than 50% respiratory variability, suggesting right atrial pressure of 15 mmHg. IAS/Shunts: No atrial level shunt detected by color flow Doppler.  LEFT VENTRICLE PLAX 2D LVIDd:         6.00 cm LVIDs:         5.20 cm LV PW:         1.10 cm LV IVS:        1.00 cm LVOT diam:     2.00 cm LV SV:         22 LV SV Index:   11 LVOT Area:     3.14 cm  RIGHT VENTRICLE TAPSE (M-mode): 1.4 cm LEFT ATRIUM              Index        RIGHT ATRIUM           Index LA diam:        5.00 cm  2.48 cm/m   RA Area:     21.10 cm LA Vol (A2C):   102.0 ml 50.67 ml/m  RA Volume:   64.10 ml  31.84 ml/m LA Vol (A4C):   93.0 ml  46.20 ml/m LA Biplane Vol: 101.0 ml 50.17 ml/m  AORTIC VALVE LVOT Vmax:   47.17 cm/s LVOT Vmean:  36.733 cm/s LVOT VTI:    0.070 m  AORTA Ao Root diam: 3.60 cm MITRAL VALVE               TRICUSPID VALVE MV Area (PHT): 5.02 cm    TR Peak grad:   36.0 mmHg MV Decel Time: 151 msec  TR Vmax:        300.00 cm/s MV E velocity: 97.70 cm/s                            SHUNTS                            Systemic VTI:  0.07 m                            Systemic Diam: 2.00 cm Rozann Lesches MD Electronically signed by Rozann Lesches MD Signature Date/Time: 04/23/2021/3:59:06 PM    Final     Assessment & Plan    1.  Persistent atrial flutter, looks more typical and with variable conduction today.  CHA2DS2-VASc score is 2.  Did not tolerate amiodarone yesterday (flushed sensation and hypoxemia) switched to intravenous Cardizem by primary team and heart rate is more reasonable at this point.  Continues on Xarelto.  2.  Secondary cardiomyopathy with LVEF approximately 20%, global hypokinesis and mild RV dysfunction.  Possibly tachycardia mediated, but duration of arrhythmia is uncertain at this point.   Question of regular alcohol use, but not entirely clear that excessive (12 a week reported).  3.  Severe hyponatremia as low as 114.  Placed on hypertonic saline by primary team and up to 119 this morning.  Fluid excess possible contributor although still seems out of portion.  Additional work-up pending per primary team.  Hypertonic saline discontinued this morning.  4.  Essential hypertension by history, previously on irbesartan and HCTZ which have been discontinued.  Systolics 572-620.  Discussed with Dr. Carles Collet.  We will cancel TEE guided cardioversion for today pending further improvement in electrolytes.  Plan to give dose of IV Lasix, can continue IV Cardizem for now although clearly not optimal long-term in light of his cardiomyopathy.  Also continue Xarelto.  Continue work-up for hyponatremia.  Signed, Rozann Lesches, MD  04/24/2021, 8:53 AM

## 2021-04-25 DIAGNOSIS — E871 Hypo-osmolality and hyponatremia: Secondary | ICD-10-CM | POA: Diagnosis not present

## 2021-04-25 DIAGNOSIS — F1010 Alcohol abuse, uncomplicated: Secondary | ICD-10-CM | POA: Diagnosis not present

## 2021-04-25 DIAGNOSIS — I429 Cardiomyopathy, unspecified: Secondary | ICD-10-CM | POA: Diagnosis not present

## 2021-04-25 DIAGNOSIS — I4892 Unspecified atrial flutter: Secondary | ICD-10-CM | POA: Diagnosis not present

## 2021-04-25 DIAGNOSIS — I5021 Acute systolic (congestive) heart failure: Secondary | ICD-10-CM | POA: Diagnosis not present

## 2021-04-25 LAB — CBC
HCT: 33.8 % — ABNORMAL LOW (ref 39.0–52.0)
Hemoglobin: 11.5 g/dL — ABNORMAL LOW (ref 13.0–17.0)
MCH: 29.1 pg (ref 26.0–34.0)
MCHC: 34 g/dL (ref 30.0–36.0)
MCV: 85.6 fL (ref 80.0–100.0)
Platelets: 291 10*3/uL (ref 150–400)
RBC: 3.95 MIL/uL — ABNORMAL LOW (ref 4.22–5.81)
RDW: 14.3 % (ref 11.5–15.5)
WBC: 6.7 10*3/uL (ref 4.0–10.5)
nRBC: 0 % (ref 0.0–0.2)

## 2021-04-25 LAB — BASIC METABOLIC PANEL
Anion gap: 8 (ref 5–15)
Anion gap: 7 (ref 5–15)
BUN: 11 mg/dL (ref 6–20)
BUN: 8 mg/dL (ref 6–20)
CO2: 24 mmol/L (ref 22–32)
CO2: 26 mmol/L (ref 22–32)
Calcium: 8.1 mg/dL — ABNORMAL LOW (ref 8.9–10.3)
Calcium: 8.9 mg/dL (ref 8.9–10.3)
Chloride: 95 mmol/L — ABNORMAL LOW (ref 98–111)
Chloride: 98 mmol/L (ref 98–111)
Creatinine, Ser: 0.88 mg/dL (ref 0.61–1.24)
Creatinine, Ser: 0.87 mg/dL (ref 0.61–1.24)
GFR, Estimated: >60 mL/min (ref >60)
GFR, Estimated: >60 mL/min (ref >60)
Glucose, Bld: 113 mg/dL — ABNORMAL HIGH (ref 70–99)
Glucose, Bld: 106 mg/dL — ABNORMAL HIGH (ref 70–99)
Potassium: 3 mmol/L — ABNORMAL LOW (ref 3.5–5.1)
Potassium: 3.3 mmol/L — ABNORMAL LOW (ref 3.5–5.1)
Sodium: 127 mmol/L — ABNORMAL LOW (ref 135–145)
Sodium: 131 mmol/L — ABNORMAL LOW (ref 135–145)

## 2021-04-25 LAB — COMPREHENSIVE METABOLIC PANEL
ALT: 9 U/L (ref 0–44)
AST: 14 U/L — ABNORMAL LOW (ref 15–41)
Albumin: 3.3 g/dL — ABNORMAL LOW (ref 3.5–5.0)
Alkaline Phosphatase: 58 U/L (ref 38–126)
Anion gap: 8 (ref 5–15)
BUN: 9 mg/dL (ref 6–20)
CO2: 26 mmol/L (ref 22–32)
Calcium: 8.7 mg/dL — ABNORMAL LOW (ref 8.9–10.3)
Chloride: 96 mmol/L — ABNORMAL LOW (ref 98–111)
Creatinine, Ser: 0.91 mg/dL (ref 0.61–1.24)
GFR, Estimated: >60 mL/min (ref >60)
Glucose, Bld: 101 mg/dL — ABNORMAL HIGH (ref 70–99)
Potassium: 3.1 mmol/L — ABNORMAL LOW (ref 3.5–5.1)
Sodium: 130 mmol/L — ABNORMAL LOW (ref 135–145)
Total Bilirubin: 0.5 mg/dL (ref 0.3–1.2)
Total Protein: 6.1 g/dL — ABNORMAL LOW (ref 6.5–8.1)

## 2021-04-25 MED ORDER — VITAMIN B-12 1000 MCG PO TABS
500.0000 ug | ORAL_TABLET | Freq: Every day | ORAL | Status: DC
  Administered 2021-04-25 – 2021-04-28 (×4): 500 ug via ORAL
  Filled 2021-04-25 (×4): qty 1

## 2021-04-25 MED ORDER — FUROSEMIDE 40 MG PO TABS
40.0000 mg | ORAL_TABLET | Freq: Every day | ORAL | Status: DC
  Administered 2021-04-26 – 2021-04-28 (×3): 40 mg via ORAL
  Filled 2021-04-25 (×3): qty 1

## 2021-04-25 MED ORDER — POTASSIUM CHLORIDE CRYS ER 20 MEQ PO TBCR
60.0000 meq | EXTENDED_RELEASE_TABLET | Freq: Once | ORAL | Status: AC
  Administered 2021-04-25: 60 meq via ORAL
  Filled 2021-04-25: qty 3

## 2021-04-25 NOTE — Progress Notes (Addendum)
[facility] NEPHROLOGY PROGRESS NOTE  Assessment/ Plan: Pt is a 55 y.o. yo male  with past medical history of hypertension, alcohol abuse, tobacco use, acid reflux, recent diagnosis of atrial flutter and is started on metoprolol, Xarelto presents with chest discomfort, shortness of breath and dizziness, seen as a consultation for the evaluation of hyponatremia.  #Acute on chronic hyponatremia, likely hypervolemic due to new onset of systolic CHF concomitant with the use of HCTZ and beer potomania. Urine sodium was 36 and osmolality 144 likely after Lasix administration. TSH and am cortisol unremarkable.  He initially received around 12 hours of 3% saline at 20 cc an hour which was discontinued yesterday morning.  Treated with IV diuretics with good response.  Sodium level 131 today.  He is clinically looks good and euvolemic therefore I will switch IV Lasix to p.o. Lasix starting from tomorrow.  Recommend to discontinue HCTZ permanently and repeat BMP with PCP in a week.  If remains hyponatremic then the PCP can refer to [place].  I have discussed this with the patient and his wife.     #Atrial flutter with RVR: Apparently amiodarone caused pulse sensation and hypoxemia therefore currently on IV Cardizem.  Xarelto for anticoagulation per cardiology.  Noted he is being transferred to [facility] for further care.   #Acute systolic CHF with EF of 02% with global hypokinesia and mild RV dysfunction possibly tachycardia mediated.  Cardiology is following and plan for TEE noted.  Diuretics as discussed above.  #History of essential hypertension: Blood pressure is soft.  Continue to hold irbesartan and discontinue HCTZ completely.   #Iron deficiency anemia: Received IV iron.  Monitor hemoglobin.  #Hypokalemia: Replete potassium chloride.  Monitor lab.  I will sign off, please call back with question.  Subjective: Seen and examined at bedside.  With IV Lasix he had around 5.6 L of urine  output.  Clinically feels much better.  He denies shortness of breath, chest pain, cough, nausea, vomiting, headache, dizziness or lightheadedness.  His wife was present at the bedside. Objective Vital signs in last 24 hours: Vitals:   04/24/21 2300 04/25/21 0058 04/25/21 0300 04/25/21 0400  BP: 93/67  94/65 116/60  Pulse: 73  95 65  Resp: 18  16 (!) 24  Temp:  98.3 F (36.8 C)  97.9 F (36.6 C)  TempSrc:  Oral  Oral  SpO2: 96%  93% 94%  Weight:    75.4 kg  Height:       Weight change: -3.979 kg  Intake/Output Summary (Last 24 hours) at 04/25/2021 1026 Last data filed at 04/25/2021 0400 Gross per 24 hour  Intake 845.71 ml  Output 4425 ml  Net -3579.29 ml       Labs: Basic Metabolic Panel: Recent Labs  Lab 04/25/21 0150 04/25/21 0453 04/25/21 0852  NA 127* 130* 131*  K 3.0* 3.1* 3.3*  CL 95* 96* 98  CO2 24 26 26   GLUCOSE 113* 101* 106*  BUN 11 9 8   CREATININE 0.88 0.91 0.87  CALCIUM 8.1* 8.7* 8.9   Liver Function Tests: Recent Labs  Lab 04/23/21 1154 04/25/21 0453  AST 18 14*  ALT 13 9  ALKPHOS 84 58  BILITOT 0.8 0.5  PROT 7.4 6.1*  ALBUMIN 4.1 3.3*   No results for input(s): LIPASE, AMYLASE in the last 168 hours. No results for input(s): AMMONIA in the last 168 hours. CBC: Recent Labs  Lab 04/23/21 1154 04/24/21 0309 04/25/21 0453  WBC 5.2 8.0 6.7  HGB 12.4* 11.3* 11.5*  HCT 36.7* 32.4* 33.8*  MCV 85.7 85.3 85.6  PLT 351 306 291   Cardiac Enzymes: No results for input(s): CKTOTAL, CKMB, CKMBINDEX, TROPONINI in the last 168 hours. CBG: No results for input(s): GLUCAP in the last 168 hours.  Iron Studies:  Recent Labs    04/23/21 1154  IRON 23*  TIBC 529*  FERRITIN 15*   Studies/Results: DG Chest 2 View  Result Date: 04/24/2021 CLINICAL DATA:  Dyspnea EXAM: CHEST - 2 VIEW COMPARISON:  04/23/2021 FINDINGS: Stable cardiomediastinal contours. Increasing hazy opacity at the right lung base. Small layering bilateral pleural effusions.  No pneumothorax. IMPRESSION: Small bilateral pleural effusions with increasing hazy opacity at the right lung base, atelectasis versus pneumonia. Electronically Signed   By: Davina Poke D.O.   On: 04/24/2021 07:39   DG Chest Port 1 View  Result Date: 04/23/2021 CLINICAL DATA:  Shortness of breath EXAM: PORTABLE CHEST 1 VIEW COMPARISON:  Chest x-ray dated August 31, 2009 FINDINGS: Cardiac and mediastinal contours are within normal limits for AP technique. Mild bilateral interstitial opacities. No focal consolidation. No large pleural effusion or pneumothorax. IMPRESSION: Mild bilateral interstitial opacities, possibly due to pulmonary edema. Electronically Signed   By: Yetta Glassman M.D.   On: 04/23/2021 12:36   DG Chest Port 1V same Day  Result Date: 04/23/2021 CLINICAL DATA:  Shortness of breath and chest tightness. EXAM: PORTABLE CHEST 1 VIEW COMPARISON:  Chest x-ray 04/23/2021. FINDINGS: Heart size is upper limits of normal, unchanged. There is no focal lung consolidation, pleural effusion or pneumothorax. No acute fractures are seen. IMPRESSION: No active disease. Electronically Signed   By: Ronney Asters M.D.   On: 04/23/2021 19:37   ECHOCARDIOGRAM COMPLETE  Result Date: 04/23/2021    ECHOCARDIOGRAM REPORT   Patient Name:   Bryan Pace Date of Exam: 04/23/2021 Medical Rec #:  [id number]      Height:       72.0 in Accession #:    [id number]     Weight:       175.0 lb Date of Birth:  1966-03-19     BSA:          2.013 m Patient Age:    46 years       BP:           109/98 mmHg Patient Gender: M              HR:           112 bpm. Exam Location:  [facility] Procedure: 2D Echo, Cardiac Doppler and Color Doppler STAT ECHO Indications:    Abnormal ECG R94.31  History:        Patient has no prior history of Echocardiogram examinations.                 Arrythmias:Atrial Flutter; Risk Factors:Hypertension.  Sonographer:    Alvino Chapel RCS Referring Phys: [phone] COURAGE EMOKPAE IMPRESSIONS   1. Left ventricular ejection fraction, by estimation, is approximately 20% in the setting of atrial flutter. The left ventricle has severely decreased function. The left ventricle demonstrates global hypokinesis. The left ventricular internal cavity size was mildly dilated. There is mild left ventricular hypertrophy. Left ventricular diastolic parameters are indeterminate.  2. Right ventricular systolic function is mildly reduced. The right ventricular size is normal. There is moderately elevated pulmonary artery systolic pressure. The estimated right ventricular systolic pressure is 50.0 mmHg.  3. Left atrial size was moderately dilated.  4. Right  atrial size was mildly dilated.  5. The mitral valve is grossly normal. Mild to moderate mitral valve regurgitation.  6. Tricuspid valve regurgitation is moderate.  7. The aortic valve is tricuspid. Aortic valve regurgitation is not visualized.  8. The inferior vena cava is dilated in size with <50% respiratory variability, suggesting right atrial pressure of 15 mmHg. Comparison(s): No prior Echocardiogram. FINDINGS  Left Ventricle: Left ventricular ejection fraction, by estimation, is 20%. The left ventricle has severely decreased function. The left ventricle demonstrates global hypokinesis. The left ventricular internal cavity size was mildly dilated. There is mild left ventricular hypertrophy. Left ventricular diastolic parameters are indeterminate. Right Ventricle: The right ventricular size is normal. No increase in right ventricular wall thickness. Right ventricular systolic function is mildly reduced. There is moderately elevated pulmonary artery systolic pressure. The tricuspid regurgitant velocity is 3.00 m/s, and with an assumed right atrial pressure of 15 mmHg, the estimated right ventricular systolic pressure is 85.0 mmHg. Left Atrium: Left atrial size was moderately dilated. Right Atrium: Right atrial size was mildly dilated. Pericardium: There is no evidence  of pericardial effusion. Presence of pericardial fat pad. Mitral Valve: The mitral valve is grossly normal. There is mild thickening of the mitral valve leaflet(s). Mild to moderate mitral valve regurgitation. Tricuspid Valve: The tricuspid valve is grossly normal. Tricuspid valve regurgitation is moderate. Aortic Valve: The aortic valve is tricuspid. There is mild aortic valve annular calcification. Aortic valve regurgitation is not visualized. Pulmonic Valve: The pulmonic valve was grossly normal. Pulmonic valve regurgitation is trivial. Aorta: The aortic root is normal in size and structure. Venous: The inferior vena cava is dilated in size with less than 50% respiratory variability, suggesting right atrial pressure of 15 mmHg. IAS/Shunts: No atrial level shunt detected by color flow Doppler.  LEFT VENTRICLE PLAX 2D LVIDd:         6.00 cm LVIDs:         5.20 cm LV PW:         1.10 cm LV IVS:        1.00 cm LVOT diam:     2.00 cm LV SV:         22 LV SV Index:   11 LVOT Area:     3.14 cm  RIGHT VENTRICLE TAPSE (M-mode): 1.4 cm LEFT ATRIUM              Index        RIGHT ATRIUM           Index LA diam:        5.00 cm  2.48 cm/m   RA Area:     21.10 cm LA Vol (A2C):   102.0 ml 50.67 ml/m  RA Volume:   64.10 ml  31.84 ml/m LA Vol (A4C):   93.0 ml  46.20 ml/m LA Biplane Vol: 101.0 ml 50.17 ml/m  AORTIC VALVE LVOT Vmax:   47.17 cm/s LVOT Vmean:  36.733 cm/s LVOT VTI:    0.070 m  AORTA Ao Root diam: 3.60 cm MITRAL VALVE               TRICUSPID VALVE MV Area (PHT): 5.02 cm    TR Peak grad:   36.0 mmHg MV Decel Time: 151 msec    TR Vmax:        300.00 cm/s MV E velocity: 97.70 cm/s  SHUNTS                            Systemic VTI:  0.07 m                            Systemic Diam: 2.00 cm Rozann Lesches MD Electronically signed by Rozann Lesches MD Signature Date/Time: 04/23/2021/3:59:06 PM    Final     Medications: Infusions:  sodium chloride Stopped (04/24/21 0914)   diltiazem  (CARDIZEM) infusion 10 mg/hr (04/24/21 1424)    Scheduled Medications:  Chlorhexidine Gluconate Cloth  6 each Topical Daily   ferrous sulfate  325 mg Oral Q breakfast   folic acid  1 mg Oral Daily   furosemide  40 mg Intravenous BID   influenza vac split quadrivalent PF  0.5 mL Intramuscular Tomorrow-1000   multivitamin with minerals  1 tablet Oral Daily   pantoprazole  40 mg Oral Daily   rivaroxaban  20 mg Oral Q supper   sodium chloride flush  3 mL Intravenous Q12H   sodium chloride flush  3 mL Intravenous Q12H   thiamine  100 mg Oral Daily   Or   thiamine  100 mg Intravenous Daily    have reviewed scheduled and prn medications.  Physical Exam: General:NAD, comfortable Heart: Tachycardic, s1s2 nl Lungs:clear b/l, no crackle Abdomen:soft, Non-tender, non-distended Extremities:No edema Neurology: Alert, awake, nonfocal  Eastin Swing Prasad Johm Pfannenstiel 04/25/2021,10:26 AM  LOS: 1 day

## 2021-04-25 NOTE — Progress Notes (Addendum)
Progress Note  Patient Name: Bryan Pace Date of Encounter: 04/25/2021  [facility] Cardiologist: Carlyle Dolly, MD   Subjective   Breathing improved. No chest pain or palpitations.  Inpatient Medications    Scheduled Meds:  Chlorhexidine Gluconate Cloth  6 each Topical Daily   ferrous sulfate  325 mg Oral Q breakfast   folic acid  1 mg Oral Daily   furosemide  40 mg Intravenous BID   influenza vac split quadrivalent PF  0.5 mL Intramuscular Tomorrow-1000   multivitamin with minerals  1 tablet Oral Daily   pantoprazole  40 mg Oral Daily   potassium chloride  60 mEq Oral Once   rivaroxaban  20 mg Oral Q supper   sodium chloride flush  3 mL Intravenous Q12H   sodium chloride flush  3 mL Intravenous Q12H   thiamine  100 mg Oral Daily   Or   thiamine  100 mg Intravenous Daily   Continuous Infusions:  sodium chloride Stopped (04/24/21 0914)   diltiazem (CARDIZEM) infusion 10 mg/hr (04/24/21 1424)   PRN Meds: sodium chloride, acetaminophen **OR** acetaminophen, bisacodyl, LORazepam **OR** LORazepam, ondansetron **OR** ondansetron (ZOFRAN) IV, polyethylene glycol, sodium chloride flush   Vital Signs    Vitals:   04/24/21 2300 04/25/21 0058 04/25/21 0300 04/25/21 0400  BP: 93/67  94/65 116/60  Pulse: 73  95 65  Resp: 18  16 (!) 24  Temp:  98.3 F (36.8 C)  97.9 F (36.6 C)  TempSrc:  Oral  Oral  SpO2: 96%  93% 94%  Weight:    75.4 kg  Height:        Intake/Output Summary (Last 24 hours) at 04/25/2021 0841 Last data filed at 04/25/2021 0400 Gross per 24 hour  Intake 1262.49 ml  Output 5675 ml  Net -4412.51 ml   Last 3 Weights 04/25/2021 04/24/2021 04/23/2021  Weight (lbs) 166 lb 3.6 oz 171 lb 15.3 oz 175 lb 11.3 oz  Weight (kg) 75.4 kg 78 kg 79.7 kg      Telemetry    Atrial flutter with RVR, HR 80's to 110's, peaking into the 150's.  - Personally Reviewed  ECG    No new tracings.   Physical Exam   GEN: Pleasant male appearing in no acute  distress.   Neck: No JVD Cardiac: Irregularly irregularly, tachycardiac.  no murmurs, rubs, or gallops.  Respiratory: Clear to auscultation bilaterally. GI: Soft, nontender, non-distended  MS: No pitting edema; No deformity. Neuro:  Nonfocal  Psych: Normal affect   Labs    High Sensitivity Troponin:   Recent Labs  Lab 04/23/21 1154 04/23/21 1331  TROPONINIHS 40* 36*     Chemistry Recent Labs  Lab 04/23/21 1154 04/23/21 1731 04/23/21 1738 04/24/21 2122 04/25/21 0150 04/25/21 0453  NA 119*  --    < > 125* 127* 130*  K 4.0  --    < > 3.1* 3.0* 3.1*  CL 87*  --    < > 93* 95* 96*  CO2 23  --    < > 23 24 26   GLUCOSE 115*  --    < > 129* 113* 101*  BUN 10  --    < > 12 11 9   CREATININE 0.91  --    < > 1.11 0.88 0.91  CALCIUM 8.8*  --    < > 8.3* 8.1* 8.7*  MG  --  1.5*  --   --   --   --   PROT 7.4  --   --   --   --  6.1*  ALBUMIN 4.1  --   --   --   --  3.3*  AST 18  --   --   --   --  14*  ALT 13  --   --   --   --  9  ALKPHOS 84  --   --   --   --  58  BILITOT 0.8  --   --   --   --  0.5  GFRNONAA >60  --    < > >60 >60 >60  ANIONGAP 9  --    < > 9 8 8    < > = values in this interval not displayed.    Hematology Recent Labs  Lab 04/23/21 1154 04/24/21 0309 04/25/21 0453  WBC 5.2 8.0 6.7  RBC 4.28 3.80* 3.95*  HGB 12.4* 11.3* 11.5*  HCT 36.7* 32.4* 33.8*  MCV 85.7 85.3 85.6  MCH 29.0 29.7 29.1  MCHC 33.8 34.9 34.0  RDW 13.9 13.9 14.3  PLT 351 306 291   Thyroid  Recent Labs  Lab 04/23/21 2300  TSH 1.390    BNP Recent Labs  Lab 04/23/21 1154  BNP 521.0*     Radiology    DG Chest 2 View  Result Date: 04/24/2021 CLINICAL DATA:  Dyspnea EXAM: CHEST - 2 VIEW COMPARISON:  04/23/2021 FINDINGS: Stable cardiomediastinal contours. Increasing hazy opacity at the right lung base. Small layering bilateral pleural effusions. No pneumothorax. IMPRESSION: Small bilateral pleural effusions with increasing hazy opacity at the right lung base, atelectasis  versus pneumonia. Electronically Signed   By: Davina Poke D.O.   On: 04/24/2021 07:39   DG Chest Port 1 View  Result Date: 04/23/2021 CLINICAL DATA:  Shortness of breath EXAM: PORTABLE CHEST 1 VIEW COMPARISON:  Chest x-ray dated August 31, 2009 FINDINGS: Cardiac and mediastinal contours are within normal limits for AP technique. Mild bilateral interstitial opacities. No focal consolidation. No large pleural effusion or pneumothorax. IMPRESSION: Mild bilateral interstitial opacities, possibly due to pulmonary edema. Electronically Signed   By: Yetta Glassman M.D.   On: 04/23/2021 12:36   DG Chest Port 1V same Day  Result Date: 04/23/2021 CLINICAL DATA:  Shortness of breath and chest tightness. EXAM: PORTABLE CHEST 1 VIEW COMPARISON:  Chest x-ray 04/23/2021. FINDINGS: Heart size is upper limits of normal, unchanged. There is no focal lung consolidation, pleural effusion or pneumothorax. No acute fractures are seen. IMPRESSION: No active disease. Electronically Signed   By: Ronney Asters M.D.   On: 04/23/2021 19:37   Cardiac Studies   Echocardiogram: 04/23/2021 IMPRESSIONS    1. Left ventricular ejection fraction, by estimation, is approximately  20% in the setting of atrial flutter. The left ventricle has severely  decreased function. The left ventricle demonstrates global hypokinesis.  The left ventricular internal cavity  size was mildly dilated. There is mild left ventricular hypertrophy. Left  ventricular diastolic parameters are indeterminate.   2. Right ventricular systolic function is mildly reduced. The right  ventricular size is normal. There is moderately elevated pulmonary artery  systolic pressure. The estimated right ventricular systolic pressure is  38.7 mmHg.   3. Left atrial size was moderately dilated.   4. Right atrial size was mildly dilated.   5. The mitral valve is grossly normal. Mild to moderate mitral valve  regurgitation.   6. Tricuspid valve  regurgitation is moderate.   7. The aortic valve is tricuspid. Aortic valve regurgitation is not  visualized.   8. The inferior  vena cava is dilated in size with <50% respiratory  variability, suggesting right atrial pressure of 15 mmHg.   Comparison(s): No prior Echocardiogram.   Patient Profile     55 y.o. male w/ PMH of persistent atrial fibrillation (diagnosed earlier this month by PCP), HTN, GERD and alcohol use who is currently admitted for an acute CHF exacerbation. Found to have a reduced EF of 20%.   Assessment & Plan    1. Persistent Atrial Flutter - Diagnosed by his PCP earlier this month but unclear duration and noted to have moderate LA dilation by echo this admission. Was initially going to undergo TEE-guided DCCV on 10/20 but canceled due to severe hyponatremia. This has now improved to 130 this AM but K+ is low at 3.1. Will order replacement.  - He was initially on IV Amiodarone but developed flushing and hypoxemia with this. He has been on IV Cardizem which is not ideal given his cardiomyopathy but options have been limited given his borderline BP. Would plan to switch to BB therapy following TEE/DCCV. Remains on Xarelto for anticoagulation.   ADDENDUM: Unable to be performed today due to schedule availability. Reviewed with Dr. Domenic Polite and the admitting team and will arrange transfer to [facility] given no Cardiology coverage at [facility] over the weekend. Will put on the Cardiology service. Will message the Card Master to get added to the schedule for a TEE/DCCV on Monday pending normalization of electrolytes over the weekend.   2. HFrEF - Echo this admission shows a reduced EF of 20% with global HK and mildly reduced RV function and moderately elevated PASP of 51 mmHg. Felt to be possibly tachycardia-mediated in the setting of atrial flutter with RVR as he denies any recent anginal symptoms. The plan at this time is to plan for a repeat echo as an outpatient and pursue ischemic  evaluation if EF remains reduced.  - He has been receiving IV Lasix 40mg  BID with a recorded net output of -4.9 L thus far. Diuretic dosing has been deferred to Nephrology given his hyponatremia.  - Would plan to switch Cardizem to BB therapy following DCCV. Titration of GDMT has been limited given his hypotension. Hopefully this will improve with restoration of NSR and he can be started on medical therapy for his cardiomyopathy.  3. Hyponatremia/Hypokalemia - Na+ trough of 114 this admission, improved to 130 today. K+ at 3.1 and will order additional supplementation. Nephrology following as well.   4. Anemia - Hgb at 11.5 this AM. No reports of active bleeding. Continue to follow.   5. Alcohol Use/Tobacco Use - He reports consuming 2-3 beers daily and up to 6 on weekends. CIWA protocol has been ordered by the admitting team. He also smokes less than 0.5 ppd and cessation has been advised.   For questions or updates, please contact [place] Please consult [email or web address] for contact info under      Signed, Erma Heritage, PA-C  04/25/2021, 8:41 AM     Attending note:  Patient seen and examined.  I reviewed interval hospital course and discussed the case with the Delano Metz, I agree with her above findings.  Also reviewed with Dr. Carles Collet.  Patient remains in atrial flutter with variable conduction, heart rate control is reasonable on IV Cardizem 15 mg an hour.  Systolic blood pressure 42H to 110s.   Pertinent lab work includes sodium up to 130, potassium 3.1, hemoglobin 11.5.  As discussed above, plan is to have him transferred to our  cardiology service at [facility] for further management.  Need to replete potassium, check and replete magnesium as well.  Ideally would have electrolytes completely normalized prior to cardioversion attempt, we are scheduling a TEE guided cardioversion tentatively for Monday.  Continue diuresis although may need to cut back Lasix ultimately.  Once  back in sinus rhythm can focus on GDMT.  Satira Sark, M.D., F.A.C.C.

## 2021-04-25 NOTE — Progress Notes (Signed)
**  Please note that patient has a new possible allergy to Amiodarone d/t a reaction when it was started in the ED. Patient became hypoxic and felt flush, "on fire".

## 2021-04-25 NOTE — Progress Notes (Signed)
PROGRESS NOTE  Bryan Pace YDX:[id number] DOB: 06/02/1966 DOA: 04/23/2021 PCP: Celene Squibb, MD  Brief History:  55 year old male with a history of hypertension, alcohol abuse, tobacco abuse, GERD presenting with chest discomfort, shortness of breath, and dizziness of 1 day duration.  Notably, the patient saw his primary care provider who diagnosed him with atrial flutter and started him on metoprolol and Xarelto on 04/14/2021.  He saw Dr. Harl Bowie on 04/18/2021.  He had a nurse visit on 04/22/2021.  His metoprolol was increased to 75 mg twice daily and his irbesartan was discontinued secondary to soft blood pressure.  Plans were made for TEE cardioversion.  However, he began developing symptoms of chest discomfort and shortness of breath on 04/23/2021.  He states that he drinks about 10-12 beers per week.  He denies any liquor or wine.  He has a 30-pack-year history of tobacco but quit smoking 7 months prior to this admission.  He denies any fevers, chills, headache, neck pain, nausea, vomiting, diarrhea, abdominal pain, dysuria, hematuria. In the emergency department, the patient was afebrile with soft blood pressures.  His heart rate was 141.  The patient was started on a diltiazem drip initially.  He was seen by cardiology.  There is concern for fluid overload.  He was also noted to have a sodium of 115 at time of admission.  He was started on 3% hypertonic saline.   Assessment/Plan: Symptomatic atrial flutter with RVR -Appreciate cardiology evaluation -04/23/2021 echo EF 20%, global HK, PASP 51, moderate TR, mild to moderate MR -TSH 1.390 -Case discussed with cardiology, Dr. Domenic Polite -did not tolerate amiodarone (flushing and hypoxia) -continue diltiazem short term -10/21--discussed with cardiology>>transfer to [facility] for further titration of meds and cardioversion on 04/28/21   Hyponatremia -due to fluid overload -d/c hypertonic saline -IV Lasix initiated--received 40 mg  x 3 -Discontinue hypertonic saline -FeNa--1.44% -serum osm--250 -urine [facility]--947 -may certainly have a component of potomania and polydipsia per patient hx (6 x 20 ounce bottles water daily) -his HCTZ was stopped on 04/18/21 -Na up to 131 with IV lasix   Acute respiratory failure with hypoxia -moved to ICU 10/19 evening  -had saturation 82% on RA with tachypnea RR 31 -personally reviewed CXR--increased interstitial markings, small pleural effusions -IV lasix as discussed with cardiology -now weaned to RA   Acute systolic CHF -09/62/8366 echo EF 20%, global HK -IV Lasix i40 mg--received 3 doses -start po lasix 04/26/21   Alcohol abuse -Alcohol withdrawal protocol -Continue thiamine   Tobacco abuse in remission -Quit tobacco 7 months ago   Iron deficiency anemia -Iron saturation 4%, ferritin 15 -given nulecit x 1 on 04/24/21 -given B12 injection as B12 low normal-->start po B12   Essential hypertension -Holding irbesartan--previously discontinued             Status is: Inpatient   The patient will require care spanning > 2 midnights and should be moved to inpatient because: Remains on IV diltiazem drip with low Na.             Family Communication:   no Family at bedside   Consultants:  cardiology, renal   Code Status:  FULL   DVT Prophylaxis:  xarelto     Procedures: As Listed in Progress Note Above   Antibiotics: None   Subjective:  Patient denies fevers, chills, headache, chest pain, dyspnea, nausea, vomiting, diarrhea, abdominal pain, dysuria, hematuria, hematochezia, and melena.  Objective: Vitals:  04/25/21 0900 04/25/21 1100 04/25/21 1200 04/25/21 1300  BP:  113/68 100/62 (!) 87/46  Pulse: (!) 51  (!) 52 (!) 52  Resp: 18 20 19  (!) 23  Temp:      TempSrc:      SpO2: 98%  99% 98%  Weight:      Height:        Intake/Output Summary (Last 24 hours) at 04/25/2021 1421 Last data filed at 04/25/2021 0400 Gross per 24 hour  Intake  845.71 ml  Output 4075 ml  Net -3229.29 ml   Weight change: -3.979 kg Exam:  General:  Pt is alert, follows commands appropriately, not in acute distress HEENT: No icterus, No thrush, No neck mass, Roland/AT Cardiovascular: IRRR, S1/S2, no rubs, no gallops Respiratory: CTA bilaterally, no wheezing, no crackles, no rhonchi Abdomen: Soft/+BS, non tender, non distended, no guarding Extremities: No edema, No lymphangitis, No petechiae, No rashes, no synovitis   Data Reviewed: I have personally reviewed following labs and imaging studies Basic Metabolic Panel: Recent Labs  Lab 04/23/21 1731 04/23/21 1738 04/24/21 0309 04/24/21 1606 04/24/21 2122 04/25/21 0150 04/25/21 0453 04/25/21 0852  NA  --    < > 118*  119* 121* 125* 127* 130* 131*  K  --    < > 3.6  --  3.1* 3.0* 3.1* 3.3*  CL  --    < > 90*  --  93* 95* 96* 98  CO2  --    < > 20*  --  23 24 26 26   GLUCOSE  --    < > 100*  --  129* 113* 101* 106*  BUN  --    < > 10  --  12 11 9 8   CREATININE  --    < > 0.87  --  1.11 0.88 0.91 0.87  CALCIUM  --    < > 8.0*  --  8.3* 8.1* 8.7* 8.9  MG 1.5*  --   --   --   --   --   --   --    < > = values in this interval not displayed.   Liver Function Tests: Recent Labs  Lab 04/23/21 1154 04/25/21 0453  AST 18 14*  ALT 13 9  ALKPHOS 84 58  BILITOT 0.8 0.5  PROT 7.4 6.1*  ALBUMIN 4.1 3.3*   No results for input(s): LIPASE, AMYLASE in the last 168 hours. No results for input(s): AMMONIA in the last 168 hours. Coagulation Profile: No results for input(s): INR, PROTIME in the last 168 hours. CBC: Recent Labs  Lab 04/23/21 1154 04/24/21 0309 04/25/21 0453  WBC 5.2 8.0 6.7  HGB 12.4* 11.3* 11.5*  HCT 36.7* 32.4* 33.8*  MCV 85.7 85.3 85.6  PLT 351 306 291   Cardiac Enzymes: No results for input(s): CKTOTAL, CKMB, CKMBINDEX, TROPONINI in the last 168 hours. BNP: Invalid input(s): POCBNP CBG: No results for input(s): GLUCAP in the last 168 hours. HbA1C: No results for  input(s): HGBA1C in the last 72 hours. Urine analysis:    Component Value Date/Time   COLORURINE YELLOW 09/30/2012 [place] 09/30/2012 1209   LABSPEC <1.005 (L) 09/30/2012 1209   PHURINE 7.0 09/30/2012 1209   GLUCOSEU NEGATIVE 09/30/2012 1209   HGBUR NEGATIVE 09/30/2012 1209   BILIRUBINUR NEGATIVE 09/30/2012 1209   KETONESUR >80 (A) 09/30/2012 1209   PROTEINUR NEGATIVE 09/30/2012 1209   UROBILINOGEN 0.2 09/30/2012 1209   NITRITE NEGATIVE 09/30/2012 1209   LEUKOCYTESUR NEGATIVE 09/30/2012  1209   Sepsis Labs: @LABRCNTIP (procalcitonin:4,lacticidven:4) ) Recent Results (from the past 240 hour(s))  Resp Panel by RT-PCR (Flu A&B, Covid) Nasopharyngeal Swab     Status: None   Collection Time: 04/23/21  1:17 PM   Specimen: Nasopharyngeal Swab; Nasopharyngeal(NP) swabs in vial transport medium  Result Value Ref Range Status   SARS Coronavirus 2 by RT PCR NEGATIVE NEGATIVE Final    Comment: (NOTE) SARS-CoV-2 target nucleic acids are NOT DETECTED.  The SARS-CoV-2 RNA is generally detectable in upper respiratory specimens during the acute phase of infection. The lowest concentration of SARS-CoV-2 viral copies this assay can detect is 138 copies/mL. A negative result does not preclude SARS-Cov-2 infection and should not be used as the sole basis for treatment or other patient management decisions. A negative result may occur with  improper specimen collection/handling, submission of specimen other than nasopharyngeal swab, presence of viral mutation(s) within the areas targeted by this assay, and inadequate number of viral copies(<138 copies/mL). A negative result must be combined with clinical observations, patient history, and epidemiological information. The expected result is Negative.  Fact Sheet for Patients:  EntrepreneurPulse.com.au  Fact Sheet for Healthcare Providers:  IncredibleEmployment.be  This test is no t yet  approved or cleared by the [place] FDA and  has been authorized for detection and/or diagnosis of SARS-CoV-2 by FDA under an Emergency Use Authorization (EUA). This EUA will remain  in effect (meaning this test can be used) for the duration of the COVID-19 declaration under Section 564(b)(1) of the Act, 21 U.S.C.section 360bbb-3(b)(1), unless the authorization is terminated  or revoked sooner.       Influenza A by PCR NEGATIVE NEGATIVE Final   Influenza B by PCR NEGATIVE NEGATIVE Final    Comment: (NOTE) The Xpert Xpress SARS-CoV-2/FLU/RSV plus assay is intended as an aid in the diagnosis of influenza from Nasopharyngeal swab specimens and should not be used as a sole basis for treatment. Nasal washings and aspirates are unacceptable for Xpert Xpress SARS-CoV-2/FLU/RSV testing.  Fact Sheet for Patients: EntrepreneurPulse.com.au  Fact Sheet for Healthcare Providers: IncredibleEmployment.be  This test is not yet approved or cleared by the [place] FDA and has been authorized for detection and/or diagnosis of SARS-CoV-2 by FDA under an Emergency Use Authorization (EUA). This EUA will remain in effect (meaning this test can be used) for the duration of the COVID-19 declaration under Section 564(b)(1) of the Act, 21 U.S.C. section 360bbb-3(b)(1), unless the authorization is terminated or revoked.  Performed at [facility], [address], [place], [place] [address]   MRSA Next Gen by PCR, Nasal     Status: None   Collection Time: 04/23/21  8:14 PM   Specimen: Nasal Mucosa; Nasal Swab  Result Value Ref Range Status   MRSA by PCR Next Gen NOT DETECTED NOT DETECTED Final    Comment: (NOTE) The GeneXpert MRSA Assay (FDA approved for NASAL specimens only), is one component of a comprehensive MRSA colonization surveillance program. It is not intended to diagnose MRSA infection nor to guide or monitor treatment for MRSA infections. Test  performance is not FDA approved in patients less than 65 years old. Performed at [facility], [address] [place], [place] [address]      Scheduled Meds:  Chlorhexidine Gluconate Cloth  6 each Topical Daily   ferrous sulfate  325 mg Oral Q breakfast   folic acid  1 mg Oral Daily   [START ON 04/26/2021] furosemide  40 mg Oral Daily   multivitamin  with minerals  1 tablet Oral Daily   pantoprazole  40 mg Oral Daily   rivaroxaban  20 mg Oral Q supper   sodium chloride flush  3 mL Intravenous Q12H   sodium chloride flush  3 mL Intravenous Q12H   thiamine  100 mg Oral Daily   Or   thiamine  100 mg Intravenous Daily   Continuous Infusions:  sodium chloride Stopped (04/24/21 0914)   diltiazem (CARDIZEM) infusion 12.5 mg/hr (04/25/21 1020)    Procedures/Studies: DG Chest 2 View  Result Date: 04/24/2021 CLINICAL DATA:  Dyspnea EXAM: CHEST - 2 VIEW COMPARISON:  04/23/2021 FINDINGS: Stable cardiomediastinal contours. Increasing hazy opacity at the right lung base. Small layering bilateral pleural effusions. No pneumothorax. IMPRESSION: Small bilateral pleural effusions with increasing hazy opacity at the right lung base, atelectasis versus pneumonia. Electronically Signed   By: Davina Poke D.O.   On: 04/24/2021 07:39   DG Chest Port 1 View  Result Date: 04/23/2021 CLINICAL DATA:  Shortness of breath EXAM: PORTABLE CHEST 1 VIEW COMPARISON:  Chest x-ray dated August 31, 2009 FINDINGS: Cardiac and mediastinal contours are within normal limits for AP technique. Mild bilateral interstitial opacities. No focal consolidation. No large pleural effusion or pneumothorax. IMPRESSION: Mild bilateral interstitial opacities, possibly due to pulmonary edema. Electronically Signed   By: Yetta Glassman M.D.   On: 04/23/2021 12:36   DG Chest Port 1V same Day  Result Date: 04/23/2021 CLINICAL DATA:  Shortness of breath and chest tightness. EXAM: PORTABLE CHEST 1 VIEW COMPARISON:  Chest x-ray  04/23/2021. FINDINGS: Heart size is upper limits of normal, unchanged. There is no focal lung consolidation, pleural effusion or pneumothorax. No acute fractures are seen. IMPRESSION: No active disease. Electronically Signed   By: Ronney Asters M.D.   On: 04/23/2021 19:37   ECHOCARDIOGRAM COMPLETE  Result Date: 04/23/2021    ECHOCARDIOGRAM REPORT   Patient Name:   Bryan Pace Date of Exam: 04/23/2021 Medical Rec #:  [id number]      Height:       72.0 in Accession #:    [id number]     Weight:       175.0 lb Date of Birth:  1965-11-10     BSA:          2.013 m Patient Age:    59 years       BP:           109/98 mmHg Patient Gender: M              HR:           112 bpm. Exam Location:  [facility] Procedure: 2D Echo, Cardiac Doppler and Color Doppler STAT ECHO Indications:    Abnormal ECG R94.31  History:        Patient has no prior history of Echocardiogram examinations.                 Arrythmias:Atrial Flutter; Risk Factors:Hypertension.  Sonographer:    Alvino Chapel RCS Referring Phys: [phone] COURAGE EMOKPAE IMPRESSIONS  1. Left ventricular ejection fraction, by estimation, is approximately 20% in the setting of atrial flutter. The left ventricle has severely decreased function. The left ventricle demonstrates global hypokinesis. The left ventricular internal cavity size was mildly dilated. There is mild left ventricular hypertrophy. Left ventricular diastolic parameters are indeterminate.  2. Right ventricular systolic function is mildly reduced. The right ventricular size is normal. There is moderately elevated pulmonary artery systolic pressure. The estimated right ventricular systolic  pressure is 51.0 mmHg.  3. Left atrial size was moderately dilated.  4. Right atrial size was mildly dilated.  5. The mitral valve is grossly normal. Mild to moderate mitral valve regurgitation.  6. Tricuspid valve regurgitation is moderate.  7. The aortic valve is tricuspid. Aortic valve regurgitation is not visualized.   8. The inferior vena cava is dilated in size with <50% respiratory variability, suggesting right atrial pressure of 15 mmHg. Comparison(s): No prior Echocardiogram. FINDINGS  Left Ventricle: Left ventricular ejection fraction, by estimation, is 20%. The left ventricle has severely decreased function. The left ventricle demonstrates global hypokinesis. The left ventricular internal cavity size was mildly dilated. There is mild left ventricular hypertrophy. Left ventricular diastolic parameters are indeterminate. Right Ventricle: The right ventricular size is normal. No increase in right ventricular wall thickness. Right ventricular systolic function is mildly reduced. There is moderately elevated pulmonary artery systolic pressure. The tricuspid regurgitant velocity is 3.00 m/s, and with an assumed right atrial pressure of 15 mmHg, the estimated right ventricular systolic pressure is 00.1 mmHg. Left Atrium: Left atrial size was moderately dilated. Right Atrium: Right atrial size was mildly dilated. Pericardium: There is no evidence of pericardial effusion. Presence of pericardial fat pad. Mitral Valve: The mitral valve is grossly normal. There is mild thickening of the mitral valve leaflet(s). Mild to moderate mitral valve regurgitation. Tricuspid Valve: The tricuspid valve is grossly normal. Tricuspid valve regurgitation is moderate. Aortic Valve: The aortic valve is tricuspid. There is mild aortic valve annular calcification. Aortic valve regurgitation is not visualized. Pulmonic Valve: The pulmonic valve was grossly normal. Pulmonic valve regurgitation is trivial. Aorta: The aortic root is normal in size and structure. Venous: The inferior vena cava is dilated in size with less than 50% respiratory variability, suggesting right atrial pressure of 15 mmHg. IAS/Shunts: No atrial level shunt detected by color flow Doppler.  LEFT VENTRICLE PLAX 2D LVIDd:         6.00 cm LVIDs:         5.20 cm LV PW:         1.10 cm LV  IVS:        1.00 cm LVOT diam:     2.00 cm LV SV:         22 LV SV Index:   11 LVOT Area:     3.14 cm  RIGHT VENTRICLE TAPSE (M-mode): 1.4 cm LEFT ATRIUM              Index        RIGHT ATRIUM           Index LA diam:        5.00 cm  2.48 cm/m   RA Area:     21.10 cm LA Vol (A2C):   102.0 ml 50.67 ml/m  RA Volume:   64.10 ml  31.84 ml/m LA Vol (A4C):   93.0 ml  46.20 ml/m LA Biplane Vol: 101.0 ml 50.17 ml/m  AORTIC VALVE LVOT Vmax:   47.17 cm/s LVOT Vmean:  36.733 cm/s LVOT VTI:    0.070 m  AORTA Ao Root diam: 3.60 cm MITRAL VALVE               TRICUSPID VALVE MV Area (PHT): 5.02 cm    TR Peak grad:   36.0 mmHg MV Decel Time: 151 msec    TR Vmax:        300.00 cm/s MV E velocity: 97.70 cm/s  SHUNTS                            Systemic VTI:  0.07 m                            Systemic Diam: 2.00 cm Rozann Lesches MD Electronically signed by Rozann Lesches MD Signature Date/Time: 04/23/2021/3:59:06 PM    Final     Orson Eva, DO  [facility]  If 7PM-7AM, please contact night-coverage [email or web address] Password [email or web address] 04/25/2021, 2:21 PM   LOS: 1 day

## 2021-04-26 DIAGNOSIS — I5021 Acute systolic (congestive) heart failure: Secondary | ICD-10-CM

## 2021-04-26 DIAGNOSIS — I4892 Unspecified atrial flutter: Secondary | ICD-10-CM | POA: Diagnosis not present

## 2021-04-26 LAB — BASIC METABOLIC PANEL
Anion gap: 7 (ref 5–15)
BUN: 7 mg/dL (ref 6–20)
CO2: 27 mmol/L (ref 22–32)
Calcium: 8.8 mg/dL — ABNORMAL LOW (ref 8.9–10.3)
Chloride: 94 mmol/L — ABNORMAL LOW (ref 98–111)
Creatinine, Ser: 1.04 mg/dL (ref 0.61–1.24)
GFR, Estimated: >60 mL/min (ref >60)
Glucose, Bld: 106 mg/dL — ABNORMAL HIGH (ref 70–99)
Potassium: 3.6 mmol/L (ref 3.5–5.1)
Sodium: 128 mmol/L — ABNORMAL LOW (ref 135–145)

## 2021-04-26 LAB — MAGNESIUM: Magnesium: 1.4 mg/dL — ABNORMAL LOW (ref 1.7–2.4)

## 2021-04-26 MED ORDER — METOPROLOL TARTRATE 25 MG PO TABS
25.0000 mg | ORAL_TABLET | Freq: Three times a day (TID) | ORAL | Status: DC
  Administered 2021-04-26 – 2021-04-28 (×8): 25 mg via ORAL
  Filled 2021-04-26 (×8): qty 1

## 2021-04-26 MED ORDER — MAGNESIUM SULFATE 2 GM/50ML IV SOLN: 2.0000 g | Freq: Once | INTRAVENOUS | Status: DC

## 2021-04-26 MED ORDER — POTASSIUM CHLORIDE CRYS ER 20 MEQ PO TBCR
40.0000 meq | EXTENDED_RELEASE_TABLET | Freq: Four times a day (QID) | ORAL | Status: AC
  Administered 2021-04-26 (×2): 40 meq via ORAL
  Filled 2021-04-26 (×2): qty 2

## 2021-04-26 MED ORDER — MAGNESIUM SULFATE 4 GM/100ML IV SOLN
4.0000 g | Freq: Once | INTRAVENOUS | Status: AC
  Administered 2021-04-26: 4 g via INTRAVENOUS
  Filled 2021-04-26: qty 100

## 2021-04-26 MED ORDER — ALPRAZOLAM 0.5 MG PO TABS
1.0000 mg | ORAL_TABLET | Freq: Once | ORAL | Status: AC
  Administered 2021-04-26: 1 mg via ORAL
  Filled 2021-04-26: qty 2

## 2021-04-26 NOTE — Discharge Instructions (Signed)

## 2021-04-26 NOTE — Progress Notes (Signed)
Progress Note  Patient Name: Bryan Pace Date of Encounter: 04/26/2021  [facility] Cardiologist: Carlyle Dolly, MD   Subjective  No complaints Inpatient Medications    Scheduled Meds:  Chlorhexidine Gluconate Cloth  6 each Topical Daily   ferrous sulfate  325 mg Oral Q breakfast   folic acid  1 mg Oral Daily   furosemide  40 mg Oral Daily   multivitamin with minerals  1 tablet Oral Daily   pantoprazole  40 mg Oral Daily   rivaroxaban  20 mg Oral Q supper   sodium chloride flush  3 mL Intravenous Q12H   sodium chloride flush  3 mL Intravenous Q12H   thiamine  100 mg Oral Daily   Or   thiamine  100 mg Intravenous Daily   vitamin B-12  500 mcg Oral Daily   Continuous Infusions:  sodium chloride Stopped (04/24/21 0914)   diltiazem (CARDIZEM) infusion 12.5 mg/hr (04/26/21 0610)   PRN Meds: sodium chloride, acetaminophen **OR** acetaminophen, bisacodyl, LORazepam **OR** LORazepam, ondansetron **OR** ondansetron (ZOFRAN) IV, polyethylene glycol, sodium chloride flush   Vital Signs    Vitals:   04/26/21 0100 04/26/21 0118 04/26/21 0419 04/26/21 0700  BP: 111/60  104/85 113/70  Pulse: 76 62 85 (!) 59  Resp: 20 19 20 16   Temp: 98.4 F (36.9 C)  98.1 F (36.7 C) 98.1 F (36.7 C)  TempSrc: Oral  Oral Oral  SpO2: 98% 92% 95% 93%  Weight:      Height:        Intake/Output Summary (Last 24 hours) at 04/26/2021 1043 Last data filed at 04/26/2021 0939 Gross per 24 hour  Intake 572.84 ml  Output 1650 ml  Net -1077.16 ml   Last 3 Weights 04/25/2021 04/25/2021 04/24/2021  Weight (lbs) 167 lb 8.8 oz 166 lb 3.6 oz 171 lb 15.3 oz  Weight (kg) 76 kg 75.4 kg 78 kg      Telemetry    Aflutter with RVR - Personally Reviewed  ECG    N/a - Personally Reviewed  Physical Exam   GEN: No acute distress.   Neck: No JVD Cardiac: irregular, tachy Respiratory: Clear to auscultation bilaterally. GI: Soft, nontender, non-distended  MS: No edema; No  deformity. Neuro:  Nonfocal  Psych: Normal affect   Labs    High Sensitivity Troponin:   Recent Labs  Lab 04/23/21 1154 04/23/21 1331  TROPONINIHS 40* 36*     Chemistry Recent Labs  Lab 04/23/21 1154 04/23/21 1731 04/23/21 1738 04/25/21 0453 04/25/21 0852 04/26/21 0104  NA 119*  --    < > 130* 131* 128*  K 4.0  --    < > 3.1* 3.3* 3.6  CL 87*  --    < > 96* 98 94*  CO2 23  --    < > 26 26 27   GLUCOSE 115*  --    < > 101* 106* 106*  BUN 10  --    < > 9 8 7   CREATININE 0.91  --    < > 0.91 0.87 1.04  CALCIUM 8.8*  --    < > 8.7* 8.9 8.8*  MG  --  1.5*  --   --   --  1.4*  PROT 7.4  --   --  6.1*  --   --   ALBUMIN 4.1  --   --  3.3*  --   --   AST 18  --   --  14*  --   --  ALT 13  --   --  9  --   --   ALKPHOS 84  --   --  58  --   --   BILITOT 0.8  --   --  0.5  --   --   GFRNONAA >60  --    < > >60 >60 >60  ANIONGAP 9  --    < > 8 7 7    < > = values in this interval not displayed.    Lipids No results for input(s): CHOL, TRIG, HDL, LABVLDL, LDLCALC, CHOLHDL in the last 168 hours.  Hematology Recent Labs  Lab 04/23/21 1154 04/24/21 0309 04/25/21 0453  WBC 5.2 8.0 6.7  RBC 4.28 3.80* 3.95*  HGB 12.4* 11.3* 11.5*  HCT 36.7* 32.4* 33.8*  MCV 85.7 85.3 85.6  MCH 29.0 29.7 29.1  MCHC 33.8 34.9 34.0  RDW 13.9 13.9 14.3  PLT 351 306 291   Thyroid  Recent Labs  Lab 04/23/21 2300  TSH 1.390    BNP Recent Labs  Lab 04/23/21 1154  BNP 521.0*    DDimer No results for input(s): DDIMER in the last 168 hours.   Radiology    No results found.  Cardiac Studies    Patient Profile        55 y.o. male w/ PMH of persistent atrial fibrillation (diagnosed earlier this month by PCP), HTN, GERD and alcohol use who is currently admitted for an acute CHF exacerbation. Found to have a reduced EF of 20%.   Assessment & Plan    1.Persistent atrial flutter - diagnosed earlier this month by pcp - plan was for outpatient TEE/DCCV, however patient presented with  worsening SOB/DOE - inpatient TEE/DCCV delayed due to significant hyponatremia on admission - tried on amio, reported significant flushing and SOB, was stopped.  - low LVEF, appears has been on dilt gtt due to limited other options. Considered digoxin load today but low K and Mg would hold, perhaps tomorrow. Esmolol likely to be not affective. Start lopressor 25mg  tid with hold parameters, try to wean dilt gtt. Consolidated lopressor to toprol after cardioversion.  - has been on xarelto for stroke prevention.  - plans for TEE/DCCV Monday  2.Acute systolic HF - new diagnosis this admissin - echo LVEF 20% - would suspect likely tachy mediated CM given aflutter with RVR diagnosed at same time - would treat tachy arrhythmia and manage HF medically, repeat echo in next few months, if ongoing dysfunction consider ischemic testing at that time - medical therapy limited by soft bp's at times. Requiring high dose av nodal agents for rate control at this time. I thnk after cardioversion will have more room with bp's for medical therapy. - euvolemic, started on oral diuretic today.   3. Hyponatremia - thought to be hypervolemic hyponateremia, Na 116 on admission - had been on HCTZ as well, history of regular beer drining may have contributed.     For questions or updates, please contact [place] Please consult [email or web address] for contact info under        Signed, Carlyle Dolly, MD  04/26/2021, 10:43 AM

## 2021-04-27 DIAGNOSIS — I4892 Unspecified atrial flutter: Secondary | ICD-10-CM | POA: Diagnosis not present

## 2021-04-27 LAB — CBC
HCT: 31.4 % — ABNORMAL LOW (ref 39.0–52.0)
Hemoglobin: 10.5 g/dL — ABNORMAL LOW (ref 13.0–17.0)
MCH: 29.1 pg (ref 26.0–34.0)
MCHC: 33.4 g/dL (ref 30.0–36.0)
MCV: 87 fL (ref 80.0–100.0)
Platelets: 330 10*3/uL (ref 150–400)
RBC: 3.61 MIL/uL — ABNORMAL LOW (ref 4.22–5.81)
RDW: 14.8 % (ref 11.5–15.5)
WBC: 8.5 10*3/uL (ref 4.0–10.5)
nRBC: 0 % (ref 0.0–0.2)

## 2021-04-27 LAB — BASIC METABOLIC PANEL
Anion gap: 8 (ref 5–15)
BUN: 10 mg/dL (ref 6–20)
CO2: 24 mmol/L (ref 22–32)
Calcium: 8.6 mg/dL — ABNORMAL LOW (ref 8.9–10.3)
Chloride: 96 mmol/L — ABNORMAL LOW (ref 98–111)
Creatinine, Ser: 1.14 mg/dL (ref 0.61–1.24)
GFR, Estimated: >60 mL/min (ref >60)
Glucose, Bld: 109 mg/dL — ABNORMAL HIGH (ref 70–99)
Potassium: 4.3 mmol/L (ref 3.5–5.1)
Sodium: 128 mmol/L — ABNORMAL LOW (ref 135–145)

## 2021-04-27 LAB — MAGNESIUM: Magnesium: 2.1 mg/dL (ref 1.7–2.4)

## 2021-04-27 MED ORDER — OFF THE BEAT BOOK
Freq: Once | Status: AC
  Filled 2021-04-27: qty 1

## 2021-04-27 MED ORDER — ALPRAZOLAM 0.5 MG PO TABS
1.0000 mg | ORAL_TABLET | Freq: Every evening | ORAL | Status: DC | PRN
  Administered 2021-04-27: 1 mg via ORAL
  Filled 2021-04-27: qty 2

## 2021-04-27 NOTE — Plan of Care (Signed)

## 2021-04-27 NOTE — Progress Notes (Signed)
Pharmacist Heart Failure Core Measure Documentation  Assessment: Bryan Pace has an EF documented as 20% on 04/23/2021 by ECHO.  Rationale: Heart failure patients with left ventricular systolic dysfunction (LVSD) and an EF < 40% should be prescribed an angiotensin converting enzyme inhibitor (ACEI) or angiotensin receptor blocker (ARB) at discharge unless a contraindication is documented in the medical record.  This patient is not currently on an ACEI or ARB for HF.  This note is being placed in the record in order to provide documentation that a contraindication to the use of these agents is present for this encounter.  ACE Inhibitor or Angiotensin Receptor Blocker is contraindicated (specify all that apply)  []   ACEI allergy AND ARB allergy []   Angioedema []   Moderate or severe aortic stenosis []   Hyperkalemia [x]   Hypotension - would consider after DCCV if BP improves []   Renal artery stenosis []   Worsening renal function, preexisting renal disease or dysfunction  Antonietta Jewel, PharmD, BCCCP Clinical Pharmacist  Phone: [phone] 04/27/2021 2:33 PM  Please check AMION for all [facility] phone numbers After 10:00 PM, call [place] [phone]

## 2021-04-27 NOTE — Progress Notes (Addendum)
Progress Note  Patient Name: Bryan Pace Date of Encounter: 04/27/2021  [facility] Cardiologist: Carlyle Dolly, MD   Subjective   No complaints  Inpatient Medications    Scheduled Meds:  ferrous sulfate  325 mg Oral Q breakfast   folic acid  1 mg Oral Daily   furosemide  40 mg Oral Daily   metoprolol tartrate  25 mg Oral TID   multivitamin with minerals  1 tablet Oral Daily   pantoprazole  40 mg Oral Daily   rivaroxaban  20 mg Oral Q supper   sodium chloride flush  3 mL Intravenous Q12H   sodium chloride flush  3 mL Intravenous Q12H   thiamine  100 mg Oral Daily   Or   thiamine  100 mg Intravenous Daily   vitamin B-12  500 mcg Oral Daily   Continuous Infusions:  sodium chloride Stopped (04/24/21 0914)   diltiazem (CARDIZEM) infusion 10.5 mg/hr (04/27/21 0747)   PRN Meds: sodium chloride, acetaminophen **OR** acetaminophen, bisacodyl, ondansetron **OR** ondansetron (ZOFRAN) IV, polyethylene glycol, sodium chloride flush   Vital Signs    Vitals:   04/26/21 2356 04/27/21 0435 04/27/21 0645 04/27/21 0740  BP: 103/75 119/78  104/74  Pulse: (!) 51   72  Resp: 20   17  Temp: 97.9 F (36.6 C) 97.8 F (36.6 C)  97.7 F (36.5 C)  TempSrc: Oral Oral  Oral  SpO2: 95%   99%  Weight:   76.6 kg   Height:        Intake/Output Summary (Last 24 hours) at 04/27/2021 0938 Last data filed at 04/26/2021 1500 Gross per 24 hour  Intake 310.9 ml  Output 350 ml  Net -39.1 ml   Last 3 Weights 04/27/2021 04/25/2021 04/25/2021  Weight (lbs) 168 lb 14 oz 167 lb 8.8 oz 166 lb 3.6 oz  Weight (kg) 76.6 kg 76 kg 75.4 kg      Telemetry    Aflutter variable rates - Personally Reviewed  ECG    N/a - Personally Reviewed  Physical Exam   GEN: No acute distress.   Neck: No JVD Cardiac: irregular, tachy  Respiratory: Clear to auscultation bilaterally. GI: Soft, nontender, non-distended  MS: No edema; No deformity. Neuro:  Nonfocal  Psych: Normal affect   Labs     High Sensitivity Troponin:   Recent Labs  Lab 04/23/21 1154 04/23/21 1331  TROPONINIHS 40* 36*     Chemistry Recent Labs  Lab 04/23/21 1154 04/23/21 1731 04/23/21 1738 04/25/21 0453 04/25/21 0852 04/26/21 0104 04/27/21 0109  NA 119*  --    < > 130* 131* 128* 128*  K 4.0  --    < > 3.1* 3.3* 3.6 4.3  CL 87*  --    < > 96* 98 94* 96*  CO2 23  --    < > 26 26 27 24   GLUCOSE 115*  --    < > 101* 106* 106* 109*  BUN 10  --    < > 9 8 7 10   CREATININE 0.91  --    < > 0.91 0.87 1.04 1.14  CALCIUM 8.8*  --    < > 8.7* 8.9 8.8* 8.6*  MG  --  1.5*  --   --   --  1.4* 2.1  PROT 7.4  --   --  6.1*  --   --   --   ALBUMIN 4.1  --   --  3.3*  --   --   --  AST 18  --   --  14*  --   --   --   ALT 13  --   --  9  --   --   --   ALKPHOS 84  --   --  58  --   --   --   BILITOT 0.8  --   --  0.5  --   --   --   GFRNONAA >60  --    < > >60 >60 >60 >60  ANIONGAP 9  --    < > 8 7 7 8    < > = values in this interval not displayed.    Lipids No results for input(s): CHOL, TRIG, HDL, LABVLDL, LDLCALC, CHOLHDL in the last 168 hours.  Hematology Recent Labs  Lab 04/24/21 0309 04/25/21 0453 04/27/21 0109  WBC 8.0 6.7 8.5  RBC 3.80* 3.95* 3.61*  HGB 11.3* 11.5* 10.5*  HCT 32.4* 33.8* 31.4*  MCV 85.3 85.6 87.0  MCH 29.7 29.1 29.1  MCHC 34.9 34.0 33.4  RDW 13.9 14.3 14.8  PLT 306 291 330   Thyroid  Recent Labs  Lab 04/23/21 2300  TSH 1.390    BNP Recent Labs  Lab 04/23/21 1154  BNP 521.0*    DDimer No results for input(s): DDIMER in the last 168 hours.   Radiology    No results found.  Cardiac Studies     Patient Profile     55 y.o. male w/ PMH of persistent atrial fibrillation (diagnosed earlier this month by PCP), HTN, GERD and alcohol use who is currently admitted for an acute CHF exacerbation. Found to have a reduced EF of 20%.   Assessment & Plan    1.Persistent atrial flutter - diagnosed earlier this month by pcp - plan was for outpatient TEE/DCCV,  however patient presented with worsening SOB/DOE - inpatient TEE/DCCV delayed due to significant hyponatremia on admission - tried on amio, reported significant flushing and SOB, was stopped.  - low LVEF, appears has been on dilt gtt due to limited other options. Considered digoxin yesterday today but low K and Mg Esmolol likely to be not affective. Started lopressor 25mg  tid with hold parameters, consolidate to toprol after DCCV   -bps are low normal, on dilt at 10.5, lopressror 25mg  tid.  - TEE/DCCV for tomorrow. Appears orders already in place, will clarify he is on list for procedure    2.Acute systolic HF - new diagnosis this admissin - echo LVEF 20% - would suspect likely tachy mediated CM given aflutter with RVR diagnosed at same time - would treat tachy arrhythmia and manage HF medically, repeat echo in next few months, if ongoing dysfunction consider ischemic testing at that time - medical therapy limited by soft bp's at times. Requiring high dose av nodal agents for rate control at this time. I thnk after cardioversion will have more room with bp's for medical therapy. - euvolemic, started on oral diuretic     3. Hyponatremia - thought to be hypervolemic hyponateremia, Na 116 on admission - had been on HCTZ as well, history of regular beer drining may have contributed.  Na is stable  Shared Decision Making/Informed Consent{  The risks (stroke, cardiac arrhythmias rarely resulting in the need for a temporary or permanent pacemaker, skin irritation or burns and complications associated with conscious sedation including aspiration, arrhythmia, respiratory failure and death), benefits (restoration of normal sinus rhythm) and alternatives of a direct current cardioversion were explained in detail to  Bryan Pace and he agrees to proceed.     The risks [esophageal damage, perforation (1:10,000 risk), bleeding, pharyngeal hematoma as well as other potential complications associated  with conscious sedation including aspiration, arrhythmia, respiratory failure and death], benefits (treatment guidance and diagnostic support) and alternatives of a transesophageal echocardiogram were discussed in detail with Bryan Pace and he is willing to proceed.    For questions or updates, please contact [place] Please consult [email or web address] for contact info under        Signed, Carlyle Dolly, MD  04/27/2021, 9:38 AM

## 2021-04-28 ENCOUNTER — Inpatient Hospital Stay (HOSPITAL_COMMUNITY): Payer: BC Managed Care – PPO | Admitting: Certified Registered"

## 2021-04-28 ENCOUNTER — Encounter (HOSPITAL_COMMUNITY)
Admission: EM | Disposition: A | Discharge status: Home / Self Care | Payer: Self-pay | Source: Home / Self Care | Attending: Internal Medicine

## 2021-04-28 ENCOUNTER — Other Ambulatory Visit: Payer: Self-pay | Admitting: Physician Assistant

## 2021-04-28 ENCOUNTER — Inpatient Hospital Stay (HOSPITAL_COMMUNITY): Payer: BC Managed Care – PPO

## 2021-04-28 ENCOUNTER — Encounter (HOSPITAL_COMMUNITY): Payer: Self-pay | Admitting: Internal Medicine

## 2021-04-28 DIAGNOSIS — F1010 Alcohol abuse, uncomplicated: Secondary | ICD-10-CM

## 2021-04-28 DIAGNOSIS — I340 Nonrheumatic mitral (valve) insufficiency: Secondary | ICD-10-CM | POA: Diagnosis not present

## 2021-04-28 DIAGNOSIS — I4892 Unspecified atrial flutter: Secondary | ICD-10-CM

## 2021-04-28 DIAGNOSIS — I483 Typical atrial flutter: Principal | ICD-10-CM

## 2021-04-28 DIAGNOSIS — E871 Hypo-osmolality and hyponatremia: Secondary | ICD-10-CM

## 2021-04-28 DIAGNOSIS — Z720 Tobacco use: Secondary | ICD-10-CM

## 2021-04-28 DIAGNOSIS — I5022 Chronic systolic (congestive) heart failure: Secondary | ICD-10-CM

## 2021-04-28 DIAGNOSIS — I5021 Acute systolic (congestive) heart failure: Secondary | ICD-10-CM | POA: Diagnosis not present

## 2021-04-28 DIAGNOSIS — I10 Essential (primary) hypertension: Secondary | ICD-10-CM

## 2021-04-28 HISTORY — PX: CARDIOVERSION: SHX1299

## 2021-04-28 HISTORY — PX: TEE WITHOUT CARDIOVERSION: SHX5443

## 2021-04-28 LAB — BASIC METABOLIC PANEL
Anion gap: 9 (ref 5–15)
BUN: 12 mg/dL (ref 6–20)
CO2: 23 mmol/L (ref 22–32)
Calcium: 8.4 mg/dL — ABNORMAL LOW (ref 8.9–10.3)
Chloride: 96 mmol/L — ABNORMAL LOW (ref 98–111)
Creatinine, Ser: 1.13 mg/dL (ref 0.61–1.24)
GFR, Estimated: >60 mL/min (ref >60)
Glucose, Bld: 108 mg/dL — ABNORMAL HIGH (ref 70–99)
Potassium: 3.8 mmol/L (ref 3.5–5.1)
Sodium: 128 mmol/L — ABNORMAL LOW (ref 135–145)

## 2021-04-28 LAB — CBC
HCT: 31 % — ABNORMAL LOW (ref 39.0–52.0)
Hemoglobin: 10.2 g/dL — ABNORMAL LOW (ref 13.0–17.0)
MCH: 29.1 pg (ref 26.0–34.0)
MCHC: 32.9 g/dL (ref 30.0–36.0)
MCV: 88.3 fL (ref 80.0–100.0)
Platelets: 294 10*3/uL (ref 150–400)
RBC: 3.51 MIL/uL — ABNORMAL LOW (ref 4.22–5.81)
RDW: 15.1 % (ref 11.5–15.5)
WBC: 8.3 10*3/uL (ref 4.0–10.5)
nRBC: 0 % (ref 0.0–0.2)

## 2021-04-28 LAB — MAGNESIUM: Magnesium: 1.7 mg/dL (ref 1.7–2.4)

## 2021-04-28 SURGERY — ECHOCARDIOGRAM, TRANSESOPHAGEAL
Anesthesia: Monitor Anesthesia Care

## 2021-04-28 MED ORDER — FUROSEMIDE 40 MG PO TABS: 40.0000 mg | ORAL_TABLET | Freq: Every day | ORAL | 2 refills | Status: DC

## 2021-04-28 MED ORDER — PROPOFOL 10 MG/ML IV BOLUS
INTRAVENOUS | Status: DC | PRN
  Administered 2021-04-28: 20 mg via INTRAVENOUS

## 2021-04-28 MED ORDER — METOPROLOL SUCCINATE ER 50 MG PO TB24: 75.0000 mg | ORAL_TABLET | Freq: Two times a day (BID) | ORAL | 2 refills | Status: DC

## 2021-04-28 MED ORDER — PROPOFOL 500 MG/50ML IV EMUL
INTRAVENOUS | Status: DC | PRN
  Administered 2021-04-28: 100 ug/kg/min via INTRAVENOUS

## 2021-04-28 MED ORDER — BUTAMBEN-TETRACAINE-BENZOCAINE 2-2-14 % EX AERO
INHALATION_SPRAY | CUTANEOUS | Status: DC | PRN
  Administered 2021-04-28: 1 via TOPICAL

## 2021-04-28 MED ORDER — MAGNESIUM SULFATE 2 GM/50ML IV SOLN
2.0000 g | Freq: Once | INTRAVENOUS | Status: AC
  Administered 2021-04-28: 2 g via INTRAVENOUS
  Filled 2021-04-28: qty 50

## 2021-04-28 MED ORDER — LISINOPRIL 2.5 MG PO TABS: 2.5000 mg | ORAL_TABLET | Freq: Every day | ORAL | Status: DC

## 2021-04-28 MED ORDER — METOPROLOL SUCCINATE ER 50 MG PO TB24: 50.0000 mg | ORAL_TABLET | Freq: Two times a day (BID) | ORAL | Status: DC

## 2021-04-28 NOTE — Transfer of Care (Addendum)
Immediate Anesthesia Transfer of Care Note  Patient: Bryan Pace  Procedure(s) Performed: TRANSESOPHAGEAL ECHOCARDIOGRAM (TEE) CARDIOVERSION  Patient Location: Endoscopy Unit  Anesthesia Type:MAC  Level of Consciousness: drowsy and patient cooperative  Airway & Oxygen Therapy: Patient Spontanous Breathing and Patient connected to nasal cannula oxygen  Post-op Assessment: Report given to RN, Post -op Vital signs reviewed and stable and Patient moving all extremities  Post vital signs: Reviewed and stable  Last Vitals:  Vitals Value Taken Time  BP    Temp    Pulse 93 04/28/21 1140  Resp    SpO2 96 % 04/28/21 1140  Vitals shown include unvalidated device data.  Last Pain:  Vitals:   04/28/21 1050  TempSrc: Temporal  PainSc: 0-No pain      Patients Stated Pain Goal: 0 (53/29/92 4268)  Complications: No notable events documented.

## 2021-04-28 NOTE — Plan of Care (Signed)

## 2021-04-28 NOTE — CV Procedure (Signed)
   TRANSESOPHAGEAL ECHOCARDIOGRAM GUIDED DIRECT CURRENT CARDIOVERSION  NAME:  Bryan Pace   MRN: [id number] DOB:  Mar 25, 1966   ADMIT DATE: 04/23/2021  INDICATIONS: Symptomatic atrial flutter  PROCEDURE:   Informed consent was obtained prior to the procedure. The risks, benefits and alternatives for the procedure were discussed and the patient comprehended these risks.  Risks include, but are not limited to, cough, sore throat, vomiting, nausea, somnolence, esophageal and stomach trauma or perforation, bleeding, low blood pressure, aspiration, pneumonia, infection, trauma to the teeth and death.    After a procedural time-out, the oropharynx was anesthetized and the patient was sedated by the anesthesia service. The transesophageal probe was inserted in the esophagus and stomach without difficulty and multiple views were obtained. Anesthesia was monitored by Dr. Lanetta Inch. Patient received 0 mg IV lidocaine and 219 mg IV propofol.  COMPLICATIONS:    Complications: No complications Patient tolerated procedure well.  FINDINGS:  LEFT VENTRICLE: EF = <20%. Global hypokinesis  RIGHT VENTRICLE: Grossly normal size and function.   LEFT ATRIUM: No thrombus/mass.  LEFT ATRIAL APPENDAGE: No thrombus/mass.   RIGHT ATRIUM: No thrombus/mass.  AORTIC VALVE:  Trileaflet. Trivial regurgitation. No vegetation.  MITRAL VALVE:    Normal structure. Mild regurgitation. No vegetation.  TRICUSPID VALVE: Normal structure. Mild regurgitation. No vegetation.  PULMONIC VALVE: Grossly normal structure. Trivial regurgitation. No apparent vegetation.  INTERATRIAL SEPTUM: No PFO or ASD seen by color Doppler.  PERICARDIUM: No effusion noted.  DESCENDING AORTA: Mild diffuse plaque seen   CARDIOVERSION:     Indications:  Symptomatic Atrial Flutter  Procedure Details:  Once the TEE was complete, the patient had the defibrillator pads placed in the anterior and posterior position. Once an appropriate  level of sedation was confirmed, the patient was cardioverted x 1 with 120J of biphasic synchronized energy.  The patient converted to NSR.  There were no apparent complications.  The patient had normal neuro status and respiratory status post procedure with vitals stable as recorded elsewhere.  Adequate airway was maintained throughout and vital signs monitored per protocol.  Buford Dresser, MD, PhD [facility]  [address], [place], [place] [address] [phone]   11:33 AM

## 2021-04-28 NOTE — Interval H&P Note (Signed)
History and Physical Interval Note:  04/28/2021 10:58 AM  Bryan Pace  has presented today for surgery, with the diagnosis of AFIB.  The various methods of treatment have been discussed with the patient and family. After consideration of risks, benefits and other options for treatment, the patient has consented to  Procedure(s): TRANSESOPHAGEAL ECHOCARDIOGRAM (TEE) (N/A) CARDIOVERSION (N/A) as a surgical intervention.  The patient's history has been reviewed, patient examined, no change in status, stable for surgery.  I have reviewed the patient's chart and labs.  Questions were answered to the patient's satisfaction.     Rozella Servello Harrell Gave

## 2021-04-28 NOTE — Progress Notes (Signed)
2 PIV's removed without complication. Site clean dry and intact. Discharge instructions given to patient and new medications are waiting at pharmacy. Patient understands all follow up appointments and instructions.

## 2021-04-28 NOTE — Anesthesia Procedure Notes (Signed)
Procedure Name: MAC Date/Time: 04/28/2021 11:15 AM Performed by: Amadeo Garnet, CRNA Pre-anesthesia Checklist: Patient identified, Emergency Drugs available, Suction available and Patient being monitored Patient Re-evaluated:Patient Re-evaluated prior to induction Oxygen Delivery Method: Nasal cannula Preoxygenation: Pre-oxygenation with 100% oxygen Induction Type: IV induction Placement Confirmation: positive ETCO2 Dental Injury: Teeth and Oropharynx as per pre-operative assessment

## 2021-04-28 NOTE — Discharge Summary (Addendum)
Discharge Summary    Patient ID: Halford Goetzke MRN: [id number]; DOB: Oct 21, 1965  Admit date: 04/23/2021 Discharge date: 04/28/2021  PCP:  Celene Squibb, MD   [facility] Providers Cardiologist:  Carlyle Dolly, MD     Discharge Diagnoses    Principal Problem:   Atrial flutter, paroxysmal [facility]) Active Problems:   HTN (hypertension)   Hyponatremia   Tobacco abuse   Alcohol abuse   Typical atrial flutter ([place])   Acute respiratory failure with hypoxia ([place])   Acute systolic CHF (congestive heart failure) ([place])    Diagnostic Studies/Procedures    Echo 04/23/2021 1. Left ventricular ejection fraction, by estimation, is approximately  20% in the setting of atrial flutter. The left ventricle has severely  decreased function. The left ventricle demonstrates global hypokinesis.  The left ventricular internal cavity  size was mildly dilated. There is mild left ventricular hypertrophy. Left  ventricular diastolic parameters are indeterminate.   2. Right ventricular systolic function is mildly reduced. The right  ventricular size is normal. There is moderately elevated pulmonary artery  systolic pressure. The estimated right ventricular systolic pressure is  83.2 mmHg.   3. Left atrial size was moderately dilated.   4. Right atrial size was mildly dilated.   5. The mitral valve is grossly normal. Mild to moderate mitral valve  regurgitation.   6. Tricuspid valve regurgitation is moderate.   7. The aortic valve is tricuspid. Aortic valve regurgitation is not  visualized.   8. The inferior vena cava is dilated in size with <50% respiratory  variability, suggesting right atrial pressure of 15 mmHg.   Comparison(s): No prior Echocardiogram.    TEE 04/28/2021  1. Left ventricular ejection fraction, by estimation, is <20%. The left  ventricle has severely decreased function. The left ventricle demonstrates  global hypokinesis. The left ventricular internal cavity size  was mildly  dilated.   2. Right ventricular systolic function is normal. The right ventricular  size is normal.   3. No left atrial/left atrial appendage thrombus was detected.   4. The mitral valve is normal in structure. Mild mitral valve  regurgitation. No evidence of mitral stenosis.   5. The aortic valve is tricuspid. Aortic valve regurgitation is trivial.  No aortic stenosis is present.   6. There is mild (Grade II) plaque involving the descending aorta.   Conclusion(s)/Recommendation(s): No LA/LAA thrombus identified. Successful cardioversion performed with restoration of normal sinus rhythm.   Once the TEE was complete, the patient had the defibrillator pads placed in the anterior and posterior position. Once an appropriate level of sedation was confirmed, the patient was cardioverted x 1 with 120J of biphasic synchronized energy.  The patient converted to NSR.  There were no apparent complications.  The patient had normal neuro status and respiratory status post procedure with vitals stable as recorded elsewhere.  Adequate airway was maintained throughout and vital signs monitored per protocol. _____________   History of Present Illness     Grabiel Schmutz is a 55 y.o. male with recently diagnosed with persistent atrial flutter of uncertain duration by his PCP, started on Xarelto on October 10 and seen in the office by Dr. Harl Bowie on October 14 with plan to uptitrate beta-blocker and consider further management strategies.  He was to come back into the office for follow-up ECG on Lopressor 75 mg twice daily and then consider whether cardioversion would be necessary.   He presents today to the [facility] with his wife complaining of progressive fatigue  and shortness of breath.  Systolic blood pressure 220-254 range in ER and heart rate in the 140s in apparent atypical atrial flutter with 2:1 block.  Hospital Course     Consultants: N/A   Patient was initially seen at [facility] but later transferred to [facility] due to persistent atrial flutter.  He did not tolerate amiodarone due to flushing sensation and hypoxemia.  He was placed on IV Cardizem for rate control.  Echocardiogram shows EF 20%, global hypokinesis, mild RV dysfunction.  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  There is also question of regular alcohol use.  Hospital course was complicated by severe hyponatremia and was treated with hypertonic saline.  He was treated for acute systolic heart failure with IV diuretic, he put out about 5 L during this admission.  Patient underwent successful TEE DCCV on 04/28/2021.  TEE showed EF less than 20%, global hypokinesis, normal RV systolic function, moderate MR, trivial AI, mild plaque in the descending aorta.  After the cardioversion, patient was maintaining sinus tachycardia with heart rate in the 110s to 120s.  This was discussed with Dr. Debara Pickett who felt tachycardia is likely related to underlying cardiomyopathy.  Patient wished to be discharged today.  He has been placed on metoprolol succinate 75 mg twice a day, Lasix 40 mg daily, Xarelto 20 mg daily. BP was too soft to add ACEI. Plan to add low dose ACEI/ARB on follow up if BP allows. Will arrange 7-14 day TOC visit. Also will refer to EP service to consider ablation in the future. He will need 1 week BMET to check renal function and electrolyte.    Did the patient have an acute coronary syndrome (MI, NSTEMI, STEMI, etc) this admission?:  No                               Did the patient have a percutaneous coronary intervention (stent / angioplasty)?:  No.        The patient will be scheduled for a TOC follow up appointment in 7-14 days.  A message has been sent to the [facility] and Scheduling Pool at the office where the patient should be seen for follow up.  _____________  Discharge Vitals Blood pressure 113/84, pulse (!) 111, temperature 98.9 F (37.2 C), resp. rate 20, height 6'  (1.829 m), weight 76.7 kg, SpO2 96 %.  Filed Weights   04/25/21 2158 04/27/21 0645 04/28/21 0347  Weight: 76 kg 76.6 kg 76.7 kg    Labs & Radiologic Studies    CBC Recent Labs    04/27/21 0109 04/28/21 0038  WBC 8.5 8.3  HGB 10.5* 10.2*  HCT 31.4* 31.0*  MCV 87.0 88.3  PLT 330 270   Basic Metabolic Panel Recent Labs    04/27/21 0109 04/28/21 0038  NA 128* 128*  K 4.3 3.8  CL 96* 96*  CO2 24 23  GLUCOSE 109* 108*  BUN 10 12  CREATININE 1.14 1.13  CALCIUM 8.6* 8.4*  MG 2.1 1.7   Liver Function Tests No results for input(s): AST, ALT, ALKPHOS, BILITOT, PROT, ALBUMIN in the last 72 hours. No results for input(s): LIPASE, AMYLASE in the last 72 hours. High Sensitivity Troponin:   Recent Labs  Lab 04/23/21 1154 04/23/21 1331  TROPONINIHS 40* 36*    BNP Invalid input(s): POCBNP D-Dimer No results for input(s): DDIMER in the last 72 hours. Hemoglobin A1C No  results for input(s): HGBA1C in the last 72 hours. Fasting Lipid Panel No results for input(s): CHOL, HDL, LDLCALC, TRIG, CHOLHDL, LDLDIRECT in the last 72 hours. Thyroid Function Tests No results for input(s): TSH, T4TOTAL, T3FREE, THYROIDAB in the last 72 hours.  Invalid input(s): FREET3 _____________  DG Chest 2 View  Result Date: 04/24/2021 CLINICAL DATA:  Dyspnea EXAM: CHEST - 2 VIEW COMPARISON:  04/23/2021 FINDINGS: Stable cardiomediastinal contours. Increasing hazy opacity at the right lung base. Small layering bilateral pleural effusions. No pneumothorax. IMPRESSION: Small bilateral pleural effusions with increasing hazy opacity at the right lung base, atelectasis versus pneumonia. Electronically Signed   By: Davina Poke D.O.   On: 04/24/2021 07:39   DG Chest Port 1 View  Result Date: 04/23/2021 CLINICAL DATA:  Shortness of breath EXAM: PORTABLE CHEST 1 VIEW COMPARISON:  Chest x-ray dated August 31, 2009 FINDINGS: Cardiac and mediastinal contours are within normal limits for AP technique.  Mild bilateral interstitial opacities. No focal consolidation. No large pleural effusion or pneumothorax. IMPRESSION: Mild bilateral interstitial opacities, possibly due to pulmonary edema. Electronically Signed   By: Yetta Glassman M.D.   On: 04/23/2021 12:36   DG Chest Port 1V same Day  Result Date: 04/23/2021 CLINICAL DATA:  Shortness of breath and chest tightness. EXAM: PORTABLE CHEST 1 VIEW COMPARISON:  Chest x-ray 04/23/2021. FINDINGS: Heart size is upper limits of normal, unchanged. There is no focal lung consolidation, pleural effusion or pneumothorax. No acute fractures are seen. IMPRESSION: No active disease. Electronically Signed   By: Ronney Asters M.D.   On: 04/23/2021 19:37   ECHOCARDIOGRAM COMPLETE  Result Date: 04/23/2021    ECHOCARDIOGRAM REPORT   Patient Name:   LARON BOORMAN Date of Exam: 04/23/2021 Medical Rec #:  [id number]      Height:       72.0 in Accession #:    [id number]     Weight:       175.0 lb Date of Birth:  20-Aug-1965     BSA:          2.013 m Patient Age:    46 years       BP:           109/98 mmHg Patient Gender: M              HR:           112 bpm. Exam Location:  [facility] Procedure: 2D Echo, Cardiac Doppler and Color Doppler STAT ECHO Indications:    Abnormal ECG R94.31  History:        Patient has no prior history of Echocardiogram examinations.                 Arrythmias:Atrial Flutter; Risk Factors:Hypertension.  Sonographer:    Alvino Chapel RCS Referring Phys: [id number] COURAGE EMOKPAE IMPRESSIONS  1. Left ventricular ejection fraction, by estimation, is approximately 20% in the setting of atrial flutter. The left ventricle has severely decreased function. The left ventricle demonstrates global hypokinesis. The left ventricular internal cavity size was mildly dilated. There is mild left ventricular hypertrophy. Left ventricular diastolic parameters are indeterminate.  2. Right ventricular systolic function is mildly reduced. The right ventricular size is normal.  There is moderately elevated pulmonary artery systolic pressure. The estimated right ventricular systolic pressure is 54.6 mmHg.  3. Left atrial size was moderately dilated.  4. Right atrial size was mildly dilated.  5. The mitral valve is grossly normal. Mild to moderate mitral valve regurgitation.  6.  Tricuspid valve regurgitation is moderate.  7. The aortic valve is tricuspid. Aortic valve regurgitation is not visualized.  8. The inferior vena cava is dilated in size with <50% respiratory variability, suggesting right atrial pressure of 15 mmHg. Comparison(s): No prior Echocardiogram. FINDINGS  Left Ventricle: Left ventricular ejection fraction, by estimation, is 20%. The left ventricle has severely decreased function. The left ventricle demonstrates global hypokinesis. The left ventricular internal cavity size was mildly dilated. There is mild left ventricular hypertrophy. Left ventricular diastolic parameters are indeterminate. Right Ventricle: The right ventricular size is normal. No increase in right ventricular wall thickness. Right ventricular systolic function is mildly reduced. There is moderately elevated pulmonary artery systolic pressure. The tricuspid regurgitant velocity is 3.00 m/s, and with an assumed right atrial pressure of 15 mmHg, the estimated right ventricular systolic pressure is 30.1 mmHg. Left Atrium: Left atrial size was moderately dilated. Right Atrium: Right atrial size was mildly dilated. Pericardium: There is no evidence of pericardial effusion. Presence of pericardial fat pad. Mitral Valve: The mitral valve is grossly normal. There is mild thickening of the mitral valve leaflet(s). Mild to moderate mitral valve regurgitation. Tricuspid Valve: The tricuspid valve is grossly normal. Tricuspid valve regurgitation is moderate. Aortic Valve: The aortic valve is tricuspid. There is mild aortic valve annular calcification. Aortic valve regurgitation is not visualized. Pulmonic Valve: The  pulmonic valve was grossly normal. Pulmonic valve regurgitation is trivial. Aorta: The aortic root is normal in size and structure. Venous: The inferior vena cava is dilated in size with less than 50% respiratory variability, suggesting right atrial pressure of 15 mmHg. IAS/Shunts: No atrial level shunt detected by color flow Doppler.  LEFT VENTRICLE PLAX 2D LVIDd:         6.00 cm LVIDs:         5.20 cm LV PW:         1.10 cm LV IVS:        1.00 cm LVOT diam:     2.00 cm LV SV:         22 LV SV Index:   11 LVOT Area:     3.14 cm  RIGHT VENTRICLE TAPSE (M-mode): 1.4 cm LEFT ATRIUM              Index        RIGHT ATRIUM           Index LA diam:        5.00 cm  2.48 cm/m   RA Area:     21.10 cm LA Vol (A2C):   102.0 ml 50.67 ml/m  RA Volume:   64.10 ml  31.84 ml/m LA Vol (A4C):   93.0 ml  46.20 ml/m LA Biplane Vol: 101.0 ml 50.17 ml/m  AORTIC VALVE LVOT Vmax:   47.17 cm/s LVOT Vmean:  36.733 cm/s LVOT VTI:    0.070 m  AORTA Ao Root diam: 3.60 cm MITRAL VALVE               TRICUSPID VALVE MV Area (PHT): 5.02 cm    TR Peak grad:   36.0 mmHg MV Decel Time: 151 msec    TR Vmax:        300.00 cm/s MV E velocity: 97.70 cm/s                            SHUNTS  Systemic VTI:  0.07 m                            Systemic Diam: 2.00 cm Rozann Lesches MD Electronically signed by Rozann Lesches MD Signature Date/Time: 04/23/2021/3:59:06 PM    Final    ECHO TEE  Result Date: 04/28/2021    TRANSESOPHOGEAL ECHO REPORT   Patient Name:   Bryan Pace Date of Exam: 04/28/2021 Medical Rec #:  [id number]      Height:       72.0 in Accession #:    [id number]     Weight:       169.1 lb Date of Birth:  03-01-1966     BSA:          1.984 m Patient Age:    55 years       BP:           108/89 mmHg Patient Gender: M              HR:           145 bpm. Exam Location:  Inpatient Procedure: Transesophageal Echo, Color Doppler and Cardiac Doppler Indications:     I48.92* Unspecified atrial flutter  History:          Patient has prior history of Echocardiogram examinations, most                  recent 04/23/2021. CHF, Arrythmias:Atrial Flutter; Risk                  Factors:Hypertension, Current Smoker and ETOH.  Sonographer:     Bernadene Person RDCS Referring Phys:  [id number] Erma Heritage Diagnosing Phys: Buford Dresser MD PROCEDURE: After discussion of the risks and benefits of a TEE, an informed consent was obtained from the patient. The transesophogeal probe was passed without difficulty through the esophogus of the patient. Local oropharyngeal anesthetic was provided with Cetacaine. Sedation performed by different physician. The patient was monitored while under deep sedation. Anesthestetic sedation was provided intravenously by Anesthesiology: 219m of Propofol. The patient's vital signs; including heart rate, blood  pressure, and oxygen saturation; remained stable throughout the procedure. The patient developed no complications during the procedure. A successful direct current cardioversion was performed at 120 joules with 1 attempt. IMPRESSIONS  1. Left ventricular ejection fraction, by estimation, is <20%. The left ventricle has severely decreased function. The left ventricle demonstrates global hypokinesis. The left ventricular internal cavity size was mildly dilated.  2. Right ventricular systolic function is normal. The right ventricular size is normal.  3. No left atrial/left atrial appendage thrombus was detected.  4. The mitral valve is normal in structure. Mild mitral valve regurgitation. No evidence of mitral stenosis.  5. The aortic valve is tricuspid. Aortic valve regurgitation is trivial. No aortic stenosis is present.  6. There is mild (Grade II) plaque involving the descending aorta. Conclusion(s)/Recommendation(s): No LA/LAA thrombus identified. Successful cardioversion performed with restoration of normal sinus rhythm. FINDINGS  Left Ventricle: Left ventricular ejection fraction, by  estimation, is <20%. The left ventricle has severely decreased function. The left ventricle demonstrates global hypokinesis. The left ventricular internal cavity size was mildly dilated. Right Ventricle: The right ventricular size is normal. No increase in right ventricular wall thickness. Right ventricular systolic function is normal. Left Atrium: Left atrial size was normal in size. No left atrial/left atrial appendage thrombus was detected. Right Atrium: Right atrial size was normal in  size. Pericardium: There is no evidence of pericardial effusion. Mitral Valve: The mitral valve is normal in structure. Mild mitral valve regurgitation. No evidence of mitral valve stenosis. There is no evidence of mitral valve vegetation. Tricuspid Valve: The tricuspid valve is normal in structure. Tricuspid valve regurgitation is mild . No evidence of tricuspid stenosis. There is no evidence of tricuspid valve vegetation. Aortic Valve: The aortic valve is tricuspid. Aortic valve regurgitation is trivial. No aortic stenosis is present. There is no evidence of aortic valve vegetation. Pulmonic Valve: The pulmonic valve was grossly normal. Pulmonic valve regurgitation is trivial. No evidence of pulmonic stenosis. There is no evidence of pulmonic valve vegetation. Aorta: The aortic root and ascending aorta are structurally normal, with no evidence of dilitation. There is mild (Grade II) plaque involving the descending aorta. IAS/Shunts: No atrial level shunt detected by color flow Doppler.  TRICUSPID VALVE TR Peak grad:   18.5 mmHg TR Vmax:        215.00 cm/s Buford Dresser MD Electronically signed by Buford Dresser MD Signature Date/Time: 04/28/2021/4:31:01 PM    Final    Disposition   Pt is being discharged home today in good condition.  Follow-up Plans & Appointments     Follow-up Information     Branch, Alphonse Guild, MD Follow up.   Specialty: Cardiology Why: office scheduler will contact you for follow  up. Please give [place] a call if you have not heard from [place] in 3 business days. Contact information: [address] [place] [address] [phone]                Discharge Instructions     Diet - low sodium heart healthy   Complete by: As directed    Increase activity slowly   Complete by: As directed        Discharge Medications   Allergies as of 04/28/2021       Reactions   Amiodarone Other (See Comments)   Flushing, shortness of breath        Medication List     STOP taking these medications    metoprolol tartrate 50 MG tablet Commonly known as: LOPRESSOR   Na Sulfate-K Sulfate-Mg Sulf 17.5-3.13-1.6 GM/177ML Soln Commonly known as: Suprep Bowel Prep Kit       TAKE these medications    ALPRAZolam 1 MG tablet Commonly known as: XANAX Take 1 mg by mouth at bedtime as needed for sleep.   furosemide 40 MG tablet Commonly known as: LASIX Take 1 tablet (40 mg total) by mouth daily. Start taking on: April 29, 2021   metoprolol succinate 50 MG 24 hr tablet Commonly known as: TOPROL-XL Take 1.5 tablets (75 mg total) by mouth 2 (two) times daily. Take with or immediately following a meal.   omeprazole 40 MG capsule Commonly known as: PRILOSEC Take 40 mg by mouth daily.   rivaroxaban 20 MG Tabs tablet Commonly known as: Xarelto Take 1 tablet (20 mg total) by mouth daily with supper.           Outstanding Labs/Studies   BMET in 1 week.   Duration of Discharge Encounter   Greater than 30 minutes including physician time.  Hilbert Corrigan, [place] 04/28/2021, 5:13 PM

## 2021-04-28 NOTE — H&P (View-Only) (Signed)
Progress Note  Patient Name: Bryan Pace Date of Encounter: 04/28/2021  [facility] Cardiologist: Carlyle Dolly, MD   Subjective   Feels well today. About net even yesterday. Total 5L negative since admission. Plan for TEE/DCCV today for atrial flutter, which is not rate-controlled. Hyponatremic at 128, but improved from 116 on admission. Creatinine 1.13. H/H stable.  Inpatient Medications    Scheduled Meds:  ferrous sulfate  325 mg Oral Q breakfast   folic acid  1 mg Oral Daily   furosemide  40 mg Oral Daily   metoprolol tartrate  25 mg Oral TID   multivitamin with minerals  1 tablet Oral Daily   pantoprazole  40 mg Oral Daily   rivaroxaban  20 mg Oral Q supper   sodium chloride flush  3 mL Intravenous Q12H   sodium chloride flush  3 mL Intravenous Q12H   thiamine  100 mg Oral Daily   Or   thiamine  100 mg Intravenous Daily   vitamin B-12  500 mcg Oral Daily   Continuous Infusions:  sodium chloride Stopped (04/24/21 0914)   diltiazem (CARDIZEM) infusion 10.5 mg/hr (04/27/21 1943)   PRN Meds: sodium chloride, acetaminophen **OR** acetaminophen, ALPRAZolam, bisacodyl, ondansetron **OR** ondansetron (ZOFRAN) IV, polyethylene glycol, sodium chloride flush   Vital Signs    Vitals:   04/27/21 1945 04/27/21 2336 04/28/21 0347 04/28/21 0725  BP: 110/67 103/67 104/77 108/89  Pulse: 90 (!) 120 (!) 122 100  Resp: 16 20 18 19   Temp: 98 F (36.7 C) 98.1 F (36.7 C) 98 F (36.7 C) 98.5 F (36.9 C)  TempSrc: Oral Oral Oral Oral  SpO2: 98% 95% 94% 94%  Weight:   76.7 kg   Height:       No intake or output data in the 24 hours ending 04/28/21 0833  Last 3 Weights 04/28/2021 04/27/2021 04/25/2021  Weight (lbs) 169 lb 1.5 oz 168 lb 14 oz 167 lb 8.8 oz  Weight (kg) 76.7 kg 76.6 kg 76 kg      Telemetry    Aflutter variable rates in the 120-130's - Personally Reviewed  ECG    N/A - Personally Reviewed  Physical Exam   GEN: No acute distress.   Neck: No  JVD Cardiac: irregular, tachy  Respiratory: Clear to auscultation bilaterally. GI: Soft, nontender, non-distended  MS: No edema; No deformity. Neuro:  Nonfocal  Psych: Normal affect   Labs    High Sensitivity Troponin:   Recent Labs  Lab 04/23/21 1154 04/23/21 1331  TROPONINIHS 40* 36*     Chemistry Recent Labs  Lab 04/23/21 1154 04/23/21 1731 04/25/21 0453 04/25/21 0852 04/26/21 0104 04/27/21 0109 04/28/21 0038  NA 119*   < > 130*   < > 128* 128* 128*  K 4.0   < > 3.1*   < > 3.6 4.3 3.8  CL 87*   < > 96*   < > 94* 96* 96*  CO2 23   < > 26   < > 27 24 23   GLUCOSE 115*   < > 101*   < > 106* 109* 108*  BUN 10   < > 9   < > 7 10 12   CREATININE 0.91   < > 0.91   < > 1.04 1.14 1.13  CALCIUM 8.8*   < > 8.7*   < > 8.8* 8.6* 8.4*  MG  --    < >  --   --  1.4* 2.1 1.7  PROT 7.4  --  6.1*  --   --   --   --   ALBUMIN 4.1  --  3.3*  --   --   --   --   AST 18  --  14*  --   --   --   --   ALT 13  --  9  --   --   --   --   ALKPHOS 84  --  58  --   --   --   --   BILITOT 0.8  --  0.5  --   --   --   --   GFRNONAA >60   < > >60   < > >60 >60 >60  ANIONGAP 9   < > 8   < > 7 8 9    < > = values in this interval not displayed.    Lipids No results for input(s): CHOL, TRIG, HDL, LABVLDL, LDLCALC, CHOLHDL in the last 168 hours.  Hematology Recent Labs  Lab 04/25/21 0453 04/27/21 0109 04/28/21 0038  WBC 6.7 8.5 8.3  RBC 3.95* 3.61* 3.51*  HGB 11.5* 10.5* 10.2*  HCT 33.8* 31.4* 31.0*  MCV 85.6 87.0 88.3  MCH 29.1 29.1 29.1  MCHC 34.0 33.4 32.9  RDW 14.3 14.8 15.1  PLT 291 330 294   Thyroid  Recent Labs  Lab 04/23/21 2300  TSH 1.390    BNP Recent Labs  Lab 04/23/21 1154  BNP 521.0*    DDimer No results for input(s): DDIMER in the last 168 hours.   Radiology    No results found.  Cardiac Studies   None  Patient Profile     55 y.o. male w/ PMH of persistent atrial fibrillation (diagnosed earlier this month by PCP), HTN, GERD and alcohol use who is  currently admitted for an acute CHF exacerbation. Found to have a reduced EF of 20%.   Assessment & Plan    1.Persistent atrial flutter - diagnosed earlier this month by pcp - plan was for outpatient TEE/DCCV, however patient presented with worsening SOB/DOE - inpatient TEE/DCCV delayed due to significant hyponatremia on admission - tried on amio, reported significant flushing and SOB, was stopped.  - low LVEF, appears has been on dilt gtt due to limited other options. Considered digoxin yesterday today but low K and Mg Esmolol likely to be not affective. Started lopressor 25mg  tid with hold parameters, consolidate to toprol after DCCV  -bps are low normal, on dilt at 10.5, lopressror 25mg  tid.  - TEE/DCCV today - probably can stop dilt after case if sucessful and transition to Toprol XL  - would consider outpatient EP evaluation for ablation of flutter (EKG's show typical flutter, I do not see afib as described)    2.Acute systolic HF - new diagnosis this admissin - echo LVEF 20% - would suspect likely tachy mediated CM given aflutter with RVR diagnosed at same time - would treat tachy arrhythmia and manage HF medically, repeat echo in next few months, if ongoing dysfunction consider ischemic testing at that time - medical therapy limited by soft bp's at times. Requiring high dose av nodal agents for rate control at this time. I thnk after cardioversion will have more room with bp's for medical therapy. - euvolemic, started on oral diuretic     3. Hyponatremia - thought to be hypervolemic hyponateremia, Na 116 on admission, now up to 128. - had been on HCTZ as well, history of regular beer drining may have contributed as  well as volume overload. Na is stable   For questions or updates, please contact [place] Please consult [email or web address] for contact info under   Pixie Casino, MD, FACC, [place] Director of the Advanced Lipid Disorders  &  Cardiovascular Risk Reduction Clinic Diplomate of the American Board of Clinical Lipidology Attending Cardiologist  Direct Dial: [phone]  Fax: [phone]  Website:  [email or web address]  Pixie Casino, MD  04/28/2021, 8:33 AM

## 2021-04-28 NOTE — Progress Notes (Signed)
  Echocardiogram Echocardiogram Transesophageal has been performed.  Bryan Pace 04/28/2021, 12:00 PM

## 2021-04-28 NOTE — Progress Notes (Signed)
Progress Note  Patient Name: Bryan Pace Date of Encounter: 04/28/2021  [facility] Cardiologist: Carlyle Dolly, MD   Subjective   Feels well today. About net even yesterday. Total 5L negative since admission. Plan for TEE/DCCV today for atrial flutter, which is not rate-controlled. Hyponatremic at 128, but improved from 116 on admission. Creatinine 1.13. H/H stable.  Inpatient Medications    Scheduled Meds:  ferrous sulfate  325 mg Oral Q breakfast   folic acid  1 mg Oral Daily   furosemide  40 mg Oral Daily   metoprolol tartrate  25 mg Oral TID   multivitamin with minerals  1 tablet Oral Daily   pantoprazole  40 mg Oral Daily   rivaroxaban  20 mg Oral Q supper   sodium chloride flush  3 mL Intravenous Q12H   sodium chloride flush  3 mL Intravenous Q12H   thiamine  100 mg Oral Daily   Or   thiamine  100 mg Intravenous Daily   vitamin B-12  500 mcg Oral Daily   Continuous Infusions:  sodium chloride Stopped (04/24/21 0914)   diltiazem (CARDIZEM) infusion 10.5 mg/hr (04/27/21 1943)   PRN Meds: sodium chloride, acetaminophen **OR** acetaminophen, ALPRAZolam, bisacodyl, ondansetron **OR** ondansetron (ZOFRAN) IV, polyethylene glycol, sodium chloride flush   Vital Signs    Vitals:   04/27/21 1945 04/27/21 2336 04/28/21 0347 04/28/21 0725  BP: 110/67 103/67 104/77 108/89  Pulse: 90 (!) 120 (!) 122 100  Resp: 16 20 18 19   Temp: 98 F (36.7 C) 98.1 F (36.7 C) 98 F (36.7 C) 98.5 F (36.9 C)  TempSrc: Oral Oral Oral Oral  SpO2: 98% 95% 94% 94%  Weight:   76.7 kg   Height:       No intake or output data in the 24 hours ending 04/28/21 0833  Last 3 Weights 04/28/2021 04/27/2021 04/25/2021  Weight (lbs) 169 lb 1.5 oz 168 lb 14 oz 167 lb 8.8 oz  Weight (kg) 76.7 kg 76.6 kg 76 kg      Telemetry    Aflutter variable rates in the 120-130's - Personally Reviewed  ECG    N/A - Personally Reviewed  Physical Exam   GEN: No acute distress.   Neck: No  JVD Cardiac: irregular, tachy  Respiratory: Clear to auscultation bilaterally. GI: Soft, nontender, non-distended  MS: No edema; No deformity. Neuro:  Nonfocal  Psych: Normal affect   Labs    High Sensitivity Troponin:   Recent Labs  Lab 04/23/21 1154 04/23/21 1331  TROPONINIHS 40* 36*     Chemistry Recent Labs  Lab 04/23/21 1154 04/23/21 1731 04/25/21 0453 04/25/21 0852 04/26/21 0104 04/27/21 0109 04/28/21 0038  NA 119*   < > 130*   < > 128* 128* 128*  K 4.0   < > 3.1*   < > 3.6 4.3 3.8  CL 87*   < > 96*   < > 94* 96* 96*  CO2 23   < > 26   < > 27 24 23   GLUCOSE 115*   < > 101*   < > 106* 109* 108*  BUN 10   < > 9   < > 7 10 12   CREATININE 0.91   < > 0.91   < > 1.04 1.14 1.13  CALCIUM 8.8*   < > 8.7*   < > 8.8* 8.6* 8.4*  MG  --    < >  --   --  1.4* 2.1 1.7  PROT 7.4  --  6.1*  --   --   --   --   ALBUMIN 4.1  --  3.3*  --   --   --   --   AST 18  --  14*  --   --   --   --   ALT 13  --  9  --   --   --   --   ALKPHOS 84  --  58  --   --   --   --   BILITOT 0.8  --  0.5  --   --   --   --   GFRNONAA >60   < > >60   < > >60 >60 >60  ANIONGAP 9   < > 8   < > 7 8 9    < > = values in this interval not displayed.    Lipids No results for input(s): CHOL, TRIG, HDL, LABVLDL, LDLCALC, CHOLHDL in the last 168 hours.  Hematology Recent Labs  Lab 04/25/21 0453 04/27/21 0109 04/28/21 0038  WBC 6.7 8.5 8.3  RBC 3.95* 3.61* 3.51*  HGB 11.5* 10.5* 10.2*  HCT 33.8* 31.4* 31.0*  MCV 85.6 87.0 88.3  MCH 29.1 29.1 29.1  MCHC 34.0 33.4 32.9  RDW 14.3 14.8 15.1  PLT 291 330 294   Thyroid  Recent Labs  Lab 04/23/21 2300  TSH 1.390    BNP Recent Labs  Lab 04/23/21 1154  BNP 521.0*    DDimer No results for input(s): DDIMER in the last 168 hours.   Radiology    No results found.  Cardiac Studies   None  Patient Profile     55 y.o. male w/ PMH of persistent atrial fibrillation (diagnosed earlier this month by PCP), HTN, GERD and alcohol use who is  currently admitted for an acute CHF exacerbation. Found to have a reduced EF of 20%.   Assessment & Plan    1.Persistent atrial flutter - diagnosed earlier this month by pcp - plan was for outpatient TEE/DCCV, however patient presented with worsening SOB/DOE - inpatient TEE/DCCV delayed due to significant hyponatremia on admission - tried on amio, reported significant flushing and SOB, was stopped.  - low LVEF, appears has been on dilt gtt due to limited other options. Considered digoxin yesterday today but low K and Mg Esmolol likely to be not affective. Started lopressor 25mg  tid with hold parameters, consolidate to toprol after DCCV  -bps are low normal, on dilt at 10.5, lopressror 25mg  tid.  - TEE/DCCV today - probably can stop dilt after case if sucessful and transition to Toprol XL  - would consider outpatient EP evaluation for ablation of flutter (EKG's show typical flutter, I do not see afib as described)    2.Acute systolic HF - new diagnosis this admissin - echo LVEF 20% - would suspect likely tachy mediated CM given aflutter with RVR diagnosed at same time - would treat tachy arrhythmia and manage HF medically, repeat echo in next few months, if ongoing dysfunction consider ischemic testing at that time - medical therapy limited by soft bp's at times. Requiring high dose av nodal agents for rate control at this time. I thnk after cardioversion will have more room with bp's for medical therapy. - euvolemic, started on oral diuretic     3. Hyponatremia - thought to be hypervolemic hyponateremia, Na 116 on admission, now up to 128. - had been on HCTZ as well, history of regular beer drining may have contributed as  well as volume overload. Na is stable   For questions or updates, please contact [place] Please consult [email or web address] for contact info under   Pixie Casino, MD, FACC, [place] Director of the Advanced Lipid Disorders  &  Cardiovascular Risk Reduction Clinic Diplomate of the American Board of Clinical Lipidology Attending Cardiologist  Direct Dial: [phone]  Fax: [phone]  Website:  [email or web address]  Pixie Casino, MD  04/28/2021, 8:33 AM

## 2021-04-28 NOTE — Anesthesia Preprocedure Evaluation (Addendum)
Anesthesia Evaluation  Patient identified by MRN, date of birth, ID band Patient awake    Reviewed: Allergy & Precautions, NPO status , Patient's Chart, lab work & pertinent test results, reviewed documented beta blocker date and time   Airway Mallampati: II  TM Distance: >3 FB Neck ROM: Full    Dental no notable dental hx. (+) Teeth Intact, Dental Advisory Given   Pulmonary neg pulmonary ROS, Current Smoker and Patient abstained from smoking.,    Pulmonary exam normal breath sounds clear to auscultation       Cardiovascular hypertension, Pt. on medications and Pt. on home beta blockers +CHF  Normal cardiovascular exam+ dysrhythmias (on dilt gtt) Atrial Fibrillation + Valvular Problems/Murmurs MR  Rhythm:Irregular Rate:Normal  TTE 04/2021 1. Left ventricular ejection fraction, by estimation, is approximately  20% in the setting of atrial flutter. The left ventricle has severely  decreased function. The left ventricle demonstrates global hypokinesis.  The left ventricular internal cavity  size was mildly dilated. There is mild left ventricular hypertrophy. Left  ventricular diastolic parameters are indeterminate.  2. Right ventricular systolic function is mildly reduced. The right  ventricular size is normal. There is moderately elevated pulmonary artery  systolic pressure. The estimated right ventricular systolic pressure is  16.1 mmHg.  3. Left atrial size was moderately dilated.  4. Right atrial size was mildly dilated.  5. The mitral valve is grossly normal. Mild to moderate mitral valve  regurgitation.  6. Tricuspid valve regurgitation is moderate.  7. The aortic valve is tricuspid. Aortic valve regurgitation is not  visualized.  8. The inferior vena cava is dilated in size with <50% respiratory  variability, suggesting right atrial pressure of 15 mmHg.   Neuro/Psych negative neurological ROS  negative psych ROS    GI/Hepatic negative GI ROS, Neg liver ROS,   Endo/Other  negative endocrine ROS  Renal/GU negative Renal ROS  negative genitourinary   Musculoskeletal negative musculoskeletal ROS (+)   Abdominal   Peds  Hematology  (+) Blood dyscrasia (on xarelto), anemia ,   Anesthesia Other Findings   Reproductive/Obstetrics                           Anesthesia Physical Anesthesia Plan  ASA: 4  Anesthesia Plan: MAC   Post-op Pain Management:    Induction: Intravenous  PONV Risk Score and Plan: 0 and Propofol infusion and Treatment may vary due to age or medical condition  Airway Management Planned: Natural Airway  Additional Equipment:   Intra-op Plan:   Post-operative Plan:   Informed Consent: I have reviewed the patients History and Physical, chart, labs and discussed the procedure including the risks, benefits and alternatives for the proposed anesthesia with the patient or authorized representative who has indicated his/her understanding and acceptance.     Dental advisory given  Plan Discussed with: CRNA  Anesthesia Plan Comments:       Anesthesia Quick Evaluation

## 2021-04-29 ENCOUNTER — Encounter (HOSPITAL_COMMUNITY): Payer: Self-pay | Admitting: Cardiology

## 2021-04-29 NOTE — Anesthesia Postprocedure Evaluation (Signed)
Anesthesia Post Note  Patient: Bryan Pace  Procedure(s) Performed: TRANSESOPHAGEAL ECHOCARDIOGRAM (TEE) CARDIOVERSION     Patient location during evaluation: PACU Anesthesia Type: MAC Level of consciousness: awake and alert Pain management: pain level controlled Vital Signs Assessment: post-procedure vital signs reviewed and stable Respiratory status: spontaneous breathing, nonlabored ventilation, respiratory function stable and patient connected to nasal cannula oxygen Cardiovascular status: stable and blood pressure returned to baseline Postop Assessment: no apparent nausea or vomiting Anesthetic complications: no   No notable events documented.  Last Vitals:  Vitals:   04/28/21 1330 04/28/21 1631  BP: 121/88 113/84  Pulse: (!) 111   Resp: 20 20  Temp:  37.2 C  SpO2: 96%     Last Pain:  Vitals:   04/28/21 1330  TempSrc:   PainSc: 0-No pain                 Gearldean Lomanto L Carrington M[place]ax

## 2021-05-01 ENCOUNTER — Telehealth: Payer: Self-pay | Admitting: Cardiology

## 2021-05-01 NOTE — Telephone Encounter (Signed)
Bryan Pace - wife walked into the office regarding [name].  She states that when patient left the hospital he was told that someone would call him within 2 days of being discharged. Looking at upcoming appointment notes it states [facility] fu made on the 25 th. Patient is concerned about why no one has called him plus his wife said that he is supposed to be scheduled for another procedure.

## 2021-05-01 NOTE — Telephone Encounter (Signed)
Patient contacted regarding discharge from [facility] on 04/28/2021.  Patient understands to follow up with provider Dr. Harl Bowie on 05/23/21 at 8:250 am at [place] Patient understands his discharge instructions. Patient understands his medications and regimen. Patient understands to bring all of his medications to this visit.  Reports HR is still 123 and he is awaiting an EP appointment which has not been scheduled yet Also requesting sooner appointment

## 2021-05-01 NOTE — Telephone Encounter (Signed)
Can he come in for ekg and vitals check nursing visit. Need to see if sinus tach which he had after his cardioversion or if back in aflutter   J Godson Pollan MD

## 2021-05-01 NOTE — Telephone Encounter (Signed)
Contacted to arrange nurse visit per Dr. Harl Bowie request Offered visit tomorrow and patient declined Advised that EKG was needed since his HR was 123. Patient said he gave incorrect HR reading this morning saying his HR has been good since he came home and is around 90-95. Aware to keep visit with Dr. Lovena Le on 05/13/21 and if he develops new symptoms, to contact our office and we can schedule nurse visit that was suggested. Verbalized understanding of plan

## 2021-05-02 ENCOUNTER — Telehealth: Payer: Self-pay | Admitting: Cardiology

## 2021-05-02 DIAGNOSIS — Z0279 Encounter for issue of other medical certificate: Secondary | ICD-10-CM

## 2021-05-02 NOTE — Telephone Encounter (Signed)
FMLA received today from [employer]. This is for patient's wife Ginger.  Forms were completed today by Dr. Harl Bowie. Copies were faxed to [facility].

## 2021-05-04 ENCOUNTER — Emergency Department (HOSPITAL_COMMUNITY): Payer: BC Managed Care – PPO

## 2021-05-04 ENCOUNTER — Inpatient Hospital Stay (HOSPITAL_BASED_OUTPATIENT_CLINIC_OR_DEPARTMENT_OTHER): Payer: BC Managed Care – PPO

## 2021-05-04 ENCOUNTER — Inpatient Hospital Stay (HOSPITAL_COMMUNITY): Payer: BC Managed Care – PPO

## 2021-05-04 ENCOUNTER — Inpatient Hospital Stay (HOSPITAL_COMMUNITY)
Admission: EM | Admit: 2021-05-04 | Discharge: 2021-05-07 | DRG: 291 | Disposition: A | Discharge status: Home / Self Care | Payer: BC Managed Care – PPO | Attending: Internal Medicine | Admitting: Internal Medicine

## 2021-05-04 ENCOUNTER — Encounter (HOSPITAL_COMMUNITY): Payer: Self-pay

## 2021-05-04 DIAGNOSIS — I5023 Acute on chronic systolic (congestive) heart failure: Secondary | ICD-10-CM | POA: Diagnosis present

## 2021-05-04 DIAGNOSIS — E871 Hypo-osmolality and hyponatremia: Secondary | ICD-10-CM | POA: Diagnosis present

## 2021-05-04 DIAGNOSIS — I429 Cardiomyopathy, unspecified: Secondary | ICD-10-CM | POA: Diagnosis present

## 2021-05-04 DIAGNOSIS — I483 Typical atrial flutter: Secondary | ICD-10-CM | POA: Diagnosis not present

## 2021-05-04 DIAGNOSIS — E876 Hypokalemia: Secondary | ICD-10-CM | POA: Diagnosis present

## 2021-05-04 DIAGNOSIS — F17210 Nicotine dependence, cigarettes, uncomplicated: Secondary | ICD-10-CM | POA: Diagnosis not present

## 2021-05-04 DIAGNOSIS — I110 Hypertensive heart disease with heart failure: Secondary | ICD-10-CM | POA: Diagnosis not present

## 2021-05-04 DIAGNOSIS — F1010 Alcohol abuse, uncomplicated: Secondary | ICD-10-CM | POA: Diagnosis present

## 2021-05-04 DIAGNOSIS — I509 Heart failure, unspecified: Secondary | ICD-10-CM | POA: Diagnosis not present

## 2021-05-04 DIAGNOSIS — I5020 Unspecified systolic (congestive) heart failure: Secondary | ICD-10-CM

## 2021-05-04 DIAGNOSIS — Z20822 Contact with and (suspected) exposure to covid-19: Secondary | ICD-10-CM | POA: Diagnosis present

## 2021-05-04 DIAGNOSIS — K219 Gastro-esophageal reflux disease without esophagitis: Secondary | ICD-10-CM | POA: Diagnosis not present

## 2021-05-04 DIAGNOSIS — D75838 Other thrombocytosis: Secondary | ICD-10-CM | POA: Diagnosis not present

## 2021-05-04 DIAGNOSIS — Z7901 Long term (current) use of anticoagulants: Secondary | ICD-10-CM

## 2021-05-04 DIAGNOSIS — I5021 Acute systolic (congestive) heart failure: Secondary | ICD-10-CM | POA: Diagnosis not present

## 2021-05-04 DIAGNOSIS — J811 Chronic pulmonary edema: Secondary | ICD-10-CM | POA: Diagnosis not present

## 2021-05-04 DIAGNOSIS — F1090 Alcohol use, unspecified, uncomplicated: Secondary | ICD-10-CM | POA: Diagnosis not present

## 2021-05-04 DIAGNOSIS — J9601 Acute respiratory failure with hypoxia: Secondary | ICD-10-CM | POA: Diagnosis present

## 2021-05-04 DIAGNOSIS — I4892 Unspecified atrial flutter: Secondary | ICD-10-CM

## 2021-05-04 DIAGNOSIS — J449 Chronic obstructive pulmonary disease, unspecified: Secondary | ICD-10-CM | POA: Diagnosis not present

## 2021-05-04 DIAGNOSIS — J90 Pleural effusion, not elsewhere classified: Secondary | ICD-10-CM | POA: Diagnosis not present

## 2021-05-04 DIAGNOSIS — R0602 Shortness of breath: Secondary | ICD-10-CM | POA: Diagnosis not present

## 2021-05-04 DIAGNOSIS — I10 Essential (primary) hypertension: Secondary | ICD-10-CM | POA: Diagnosis not present

## 2021-05-04 LAB — CBC
HCT: 33.2 % — ABNORMAL LOW (ref 39.0–52.0)
Hemoglobin: 11.1 g/dL — ABNORMAL LOW (ref 13.0–17.0)
MCH: 29.5 pg (ref 26.0–34.0)
MCHC: 33.4 g/dL (ref 30.0–36.0)
MCV: 88.3 fL (ref 80.0–100.0)
Platelets: 441 10*3/uL — ABNORMAL HIGH (ref 150–400)
RBC: 3.76 MIL/uL — ABNORMAL LOW (ref 4.22–5.81)
RDW: 14.7 % (ref 11.5–15.5)
WBC: 10.8 10*3/uL — ABNORMAL HIGH (ref 4.0–10.5)
nRBC: 0 % (ref 0.0–0.2)

## 2021-05-04 LAB — TROPONIN I (HIGH SENSITIVITY)
Troponin I (High Sensitivity): 14 ng/L (ref <18)
Troponin I (High Sensitivity): 18 ng/L — ABNORMAL HIGH (ref <18)

## 2021-05-04 LAB — BLOOD GAS, VENOUS
Acid-base deficit: 0.5 mmol/L (ref 0.0–2.0)
Bicarbonate: 23.6 mmol/L (ref 20.0–28.0)
FIO2: 70
O2 Saturation: 75.2 %
Patient temperature: 36.3
pCO2, Ven: 38.5 mmHg — ABNORMAL LOW (ref 44.0–60.0)
pH, Ven: 7.404 (ref 7.250–7.430)
pO2, Ven: 46.2 mmHg — ABNORMAL HIGH (ref 32.0–45.0)

## 2021-05-04 LAB — BASIC METABOLIC PANEL
Anion gap: 10 (ref 5–15)
BUN: 8 mg/dL (ref 6–20)
CO2: 22 mmol/L (ref 22–32)
Calcium: 8.4 mg/dL — ABNORMAL LOW (ref 8.9–10.3)
Chloride: 90 mmol/L — ABNORMAL LOW (ref 98–111)
Creatinine, Ser: 0.81 mg/dL (ref 0.61–1.24)
GFR, Estimated: >60 mL/min (ref >60)
Glucose, Bld: 108 mg/dL — ABNORMAL HIGH (ref 70–99)
Potassium: 4 mmol/L (ref 3.5–5.1)
Sodium: 122 mmol/L — ABNORMAL LOW (ref 135–145)

## 2021-05-04 LAB — HEPATIC FUNCTION PANEL
ALT: 27 U/L (ref 0–44)
AST: 25 U/L (ref 15–41)
Albumin: 3.6 g/dL (ref 3.5–5.0)
Alkaline Phosphatase: 70 U/L (ref 38–126)
Bilirubin, Direct: 0.1 mg/dL (ref 0.0–0.2)
Indirect Bilirubin: 0 mg/dL — ABNORMAL LOW (ref 0.3–0.9)
Total Bilirubin: 0.1 mg/dL — ABNORMAL LOW (ref 0.3–1.2)
Total Protein: 7 g/dL (ref 6.5–8.1)

## 2021-05-04 LAB — RESP PANEL BY RT-PCR (FLU A&B, COVID) ARPGX2
Influenza A by PCR: NEGATIVE
Influenza B by PCR: NEGATIVE
SARS Coronavirus 2 by RT PCR: NEGATIVE

## 2021-05-04 LAB — BRAIN NATRIURETIC PEPTIDE: B Natriuretic Peptide: 1035 pg/mL — ABNORMAL HIGH (ref 0.0–100.0)

## 2021-05-04 LAB — D-DIMER, QUANTITATIVE: D-Dimer, Quant: 1.02 ug/mL-FEU — ABNORMAL HIGH (ref 0.00–0.50)

## 2021-05-04 MED ORDER — IPRATROPIUM-ALBUTEROL 0.5-2.5 (3) MG/3ML IN SOLN
3.0000 mL | Freq: Once | RESPIRATORY_TRACT | Status: AC
  Administered 2021-05-04: 3 mL via RESPIRATORY_TRACT
  Filled 2021-05-04: qty 3

## 2021-05-04 MED ORDER — METHYLPREDNISOLONE SODIUM SUCC 125 MG IJ SOLR
125.0000 mg | Freq: Once | INTRAMUSCULAR | Status: AC
  Administered 2021-05-04: 125 mg via INTRAVENOUS
  Filled 2021-05-04 (×2): qty 2

## 2021-05-04 MED ORDER — IPRATROPIUM BROMIDE 0.02 % IN SOLN
RESPIRATORY_TRACT | Status: AC
  Administered 2021-05-04: 0.5 mg
  Filled 2021-05-04: qty 2.5

## 2021-05-04 MED ORDER — MAGNESIUM SULFATE 2 GM/50ML IV SOLN
2.0000 g | Freq: Once | INTRAVENOUS | Status: AC
  Administered 2021-05-04: 2 g via INTRAVENOUS
  Filled 2021-05-04: qty 50

## 2021-05-04 MED ORDER — ALBUTEROL SULFATE (2.5 MG/3ML) 0.083% IN NEBU
2.5000 mg | INHALATION_SOLUTION | Freq: Once | RESPIRATORY_TRACT | Status: AC
  Administered 2021-05-04: 2.5 mg via RESPIRATORY_TRACT
  Filled 2021-05-04: qty 3

## 2021-05-04 MED ORDER — FUROSEMIDE 10 MG/ML IJ SOLN
40.0000 mg | Freq: Once | INTRAMUSCULAR | Status: AC
  Administered 2021-05-04: 40 mg via INTRAVENOUS
  Filled 2021-05-04: qty 4

## 2021-05-04 MED ORDER — IOHEXOL 350 MG/ML SOLN
100.0000 mL | Freq: Once | INTRAVENOUS | Status: AC | PRN
  Administered 2021-05-04: 75 mL via INTRAVENOUS

## 2021-05-04 MED ORDER — DIPHENHYDRAMINE HCL 50 MG/ML IJ SOLN: 50.0000 mg | Freq: Once | INTRAMUSCULAR | Status: DC

## 2021-05-04 MED ORDER — ALBUTEROL SULFATE (2.5 MG/3ML) 0.083% IN NEBU
INHALATION_SOLUTION | RESPIRATORY_TRACT | Status: AC
  Administered 2021-05-04: 2.5 mg
  Filled 2021-05-04: qty 6
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
## 2021-05-04 NOTE — ED Notes (Signed)
Pt wife came out of room saying the pt was feeling SOB, on arrival to room, pt oxygen saturations were 69% on room air. Placed on n/c at 6 liters without improvement, placed on NRB with saturations improving to 98%. Pt primary RN and Dr. Roderic Palau made aware of the same.
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
## 2021-05-04 NOTE — ED Triage Notes (Addendum)
Pt. Arrived with complaints of SHOB and chest pain. Pt. Has a history of atrial flutter.

## 2021-05-04 NOTE — ED Notes (Signed)
Back from cat scan of chest.

## 2021-05-04 NOTE — ED Notes (Signed)
RT to bedside, pt not tolerating NRB, feels increasingly SOB. Pt placed on bipap, tolerating well.

## 2021-05-04 NOTE — ED Notes (Signed)
Dr. Roderic Palau and RT at bedside.

## 2021-05-05 ENCOUNTER — Other Ambulatory Visit: Payer: Self-pay

## 2021-05-05 DIAGNOSIS — I5021 Acute systolic (congestive) heart failure: Secondary | ICD-10-CM

## 2021-05-05 DIAGNOSIS — I483 Typical atrial flutter: Secondary | ICD-10-CM

## 2021-05-05 DIAGNOSIS — F1090 Alcohol use, unspecified, uncomplicated: Secondary | ICD-10-CM | POA: Diagnosis not present

## 2021-05-05 DIAGNOSIS — E871 Hypo-osmolality and hyponatremia: Secondary | ICD-10-CM | POA: Diagnosis not present

## 2021-05-05 DIAGNOSIS — I5023 Acute on chronic systolic (congestive) heart failure: Secondary | ICD-10-CM

## 2021-05-05 LAB — COMPREHENSIVE METABOLIC PANEL
ALT: 27 U/L (ref 0–44)
AST: 24 U/L (ref 15–41)
Albumin: 3.7 g/dL (ref 3.5–5.0)
Alkaline Phosphatase: 75 U/L (ref 38–126)
Anion gap: 12 (ref 5–15)
BUN: 10 mg/dL (ref 6–20)
CO2: 24 mmol/L (ref 22–32)
Calcium: 8.8 mg/dL — ABNORMAL LOW (ref 8.9–10.3)
Chloride: 89 mmol/L — ABNORMAL LOW (ref 98–111)
Creatinine, Ser: 0.83 mg/dL (ref 0.61–1.24)
GFR, Estimated: >60 mL/min (ref >60)
Glucose, Bld: 143 mg/dL — ABNORMAL HIGH (ref 70–99)
Potassium: 4.1 mmol/L (ref 3.5–5.1)
Sodium: 125 mmol/L — ABNORMAL LOW (ref 135–145)
Total Bilirubin: 0.5 mg/dL (ref 0.3–1.2)
Total Protein: 7.4 g/dL (ref 6.5–8.1)

## 2021-05-05 LAB — CBC WITH DIFFERENTIAL/PLATELET
Abs Immature Granulocytes: 0.03 10*3/uL (ref 0.00–0.07)
Basophils Absolute: 0 10*3/uL (ref 0.0–0.1)
Basophils Relative: 0 %
Eosinophils Absolute: 0 10*3/uL (ref 0.0–0.5)
Eosinophils Relative: 0 %
HCT: 36.2 % — ABNORMAL LOW (ref 39.0–52.0)
Hemoglobin: 12.1 g/dL — ABNORMAL LOW (ref 13.0–17.0)
Immature Granulocytes: 0 %
Lymphocytes Relative: 5 %
Lymphs Abs: 0.4 10*3/uL — ABNORMAL LOW (ref 0.7–4.0)
MCH: 28.4 pg (ref 26.0–34.0)
MCHC: 33.4 g/dL (ref 30.0–36.0)
MCV: 85 fL (ref 80.0–100.0)
Monocytes Absolute: 0.1 10*3/uL (ref 0.1–1.0)
Monocytes Relative: 1 %
Neutro Abs: 9.3 10*3/uL — ABNORMAL HIGH (ref 1.7–7.7)
Neutrophils Relative %: 94 %
Platelets: 425 10*3/uL — ABNORMAL HIGH (ref 150–400)
RBC: 4.26 MIL/uL (ref 4.22–5.81)
RDW: 14.6 % (ref 11.5–15.5)
WBC: 9.9 10*3/uL (ref 4.0–10.5)
nRBC: 0 % (ref 0.0–0.2)

## 2021-05-05 LAB — TSH: TSH: 0.913 u[IU]/mL (ref 0.350–4.500)

## 2021-05-05 LAB — OSMOLALITY, URINE: Osmolality, Ur: 204 mOsm/kg — ABNORMAL LOW (ref 300–900)

## 2021-05-05 LAB — MAGNESIUM: Magnesium: 1.8 mg/dL (ref 1.7–2.4)

## 2021-05-05 LAB — NA AND K (SODIUM & POTASSIUM), RAND UR
Potassium Urine: 16 mmol/L
Sodium, Ur: 70 mmol/L

## 2021-05-05 MED ORDER — FUROSEMIDE 10 MG/ML IJ SOLN
40.0000 mg | Freq: Two times a day (BID) | INTRAMUSCULAR | Status: DC
  Administered 2021-05-05 – 2021-05-06 (×3): 40 mg via INTRAVENOUS
  Filled 2021-05-05 (×3): qty 4

## 2021-05-05 MED ORDER — ACETAMINOPHEN 650 MG RE SUPP: 650.0000 mg | Freq: Four times a day (QID) | RECTAL | Status: DC | PRN

## 2021-05-05 MED ORDER — SPIRONOLACTONE 25 MG PO TABS
12.5000 mg | ORAL_TABLET | Freq: Every day | ORAL | Status: DC
  Administered 2021-05-06: 12.5 mg via ORAL
  Filled 2021-05-05: qty 0.5
  Filled 2021-05-05: qty 1
  Filled 2021-05-05 (×2): qty 0.5
  Filled 2021-05-05: qty 1
  Filled 2021-05-05 (×3): qty 0.5

## 2021-05-05 MED ORDER — ALPRAZOLAM 1 MG PO TABS
1.0000 mg | ORAL_TABLET | Freq: Every evening | ORAL | Status: DC | PRN
  Administered 2021-05-06: 1 mg via ORAL
  Filled 2021-05-05: qty 1

## 2021-05-05 MED ORDER — METOPROLOL SUCCINATE ER 50 MG PO TB24
75.0000 mg | ORAL_TABLET | Freq: Two times a day (BID) | ORAL | Status: DC
  Administered 2021-05-05 – 2021-05-07 (×5): 75 mg via ORAL
  Filled 2021-05-05 (×5): qty 1

## 2021-05-05 MED ORDER — PANTOPRAZOLE SODIUM 40 MG PO TBEC
40.0000 mg | DELAYED_RELEASE_TABLET | Freq: Every day | ORAL | Status: DC
  Administered 2021-05-05 – 2021-05-07 (×3): 40 mg via ORAL
  Filled 2021-05-05 (×3): qty 1

## 2021-05-05 MED ORDER — ONDANSETRON HCL 4 MG/2ML IJ SOLN: 4.0000 mg | Freq: Four times a day (QID) | INTRAMUSCULAR | Status: DC | PRN

## 2021-05-05 MED ORDER — CHLORHEXIDINE GLUCONATE CLOTH 2 % EX PADS
6.0000 | MEDICATED_PAD | Freq: Every day | CUTANEOUS | Status: DC
  Administered 2021-05-05 – 2021-05-06 (×2): 6 via TOPICAL

## 2021-05-05 MED ORDER — OXYCODONE HCL 5 MG PO TABS: 5.0000 mg | ORAL_TABLET | Freq: Four times a day (QID) | ORAL | Status: DC | PRN

## 2021-05-05 MED ORDER — ONDANSETRON HCL 4 MG PO TABS: 4.0000 mg | ORAL_TABLET | Freq: Four times a day (QID) | ORAL | Status: DC | PRN

## 2021-05-05 MED ORDER — ACETAMINOPHEN 325 MG PO TABS: 650.0000 mg | ORAL_TABLET | Freq: Four times a day (QID) | ORAL | Status: DC | PRN

## 2021-05-05 MED ORDER — ORAL CARE MOUTH RINSE
15.0000 mL | Freq: Two times a day (BID) | OROMUCOSAL | Status: DC
  Administered 2021-05-05 – 2021-05-07 (×4): 15 mL via OROMUCOSAL

## 2021-05-05 MED ORDER — OXYCODONE HCL 5 MG PO TABS: 5.0000 mg | ORAL_TABLET | ORAL | Status: DC | PRN

## 2021-05-05 MED ORDER — MORPHINE SULFATE (PF) 2 MG/ML IV SOLN: 2.0000 mg | INTRAVENOUS | Status: DC | PRN

## 2021-05-05 MED ORDER — DIGOXIN 125 MCG PO TABS
0.1250 mg | ORAL_TABLET | Freq: Every day | ORAL | Status: DC
  Administered 2021-05-05 – 2021-05-07 (×3): 0.125 mg via ORAL
  Filled 2021-05-05 (×3): qty 1

## 2021-05-05 MED ORDER — METOPROLOL TARTRATE 5 MG/5ML IV SOLN
5.0000 mg | Freq: Once | INTRAVENOUS | Status: AC
  Administered 2021-05-05: 5 mg via INTRAVENOUS
  Filled 2021-05-05: qty 5

## 2021-05-05 MED ORDER — LIVING BETTER WITH HEART FAILURE BOOK: Freq: Once | Status: AC

## 2021-05-05 MED ORDER — ALBUTEROL SULFATE (2.5 MG/3ML) 0.083% IN NEBU
2.5000 mg | INHALATION_SOLUTION | Freq: Four times a day (QID) | RESPIRATORY_TRACT | Status: DC
  Administered 2021-05-05 – 2021-05-06 (×6): 2.5 mg via RESPIRATORY_TRACT
  Filled 2021-05-05 (×6): qty 3

## 2021-05-05 MED ORDER — RIVAROXABAN 20 MG PO TABS
20.0000 mg | ORAL_TABLET | Freq: Every day | ORAL | Status: DC
  Administered 2021-05-05 – 2021-05-06 (×2): 20 mg via ORAL
  Filled 2021-05-05 (×2): qty 1

## 2021-05-05 NOTE — Progress Notes (Signed)
PROGRESS NOTE    Bryan Pace  CBS:[id number] DOB: 03-Apr-1966 DOA: 05/04/2021 PCP: Celene Squibb, MD   Chief Complaint  Patient presents with   Chest Pain    Brief Narrative:  As per H&P written by Dr. Clearence Ped on 05/04/21; but briefly 55 year old male with significant history of atrial flutter, global hypokinesis and systolic heart failure (ejection fraction 25%) who presented to the hospital complaining of shortness of breath and increased dyspnea.  Work-up demonstrated pulmonary edema, vascular congestion and trace bilateral pleural effusion.  Patient in the requiring to be on BiPAP at time of admission to stabilize his breathing and TRH was contacted to place in the hospital for further evaluation and management of CHF exacerbation.  Assessment & Plan: 1-acute respiratory failure with hypoxia secondary to acute on chronic systolic heart failure. -On presentation patient was wheezing, complaining of dyspnea on exertion and in the requiring to be placed on BiPAP. -CT chest to rule out the presence of pulmonary embolism but has confirmed vascular congestion, pulmonary edema and trace bilateral pleural effusion. -Excellent response to the use of BiPAP and IV diuresis. -Patient is feeling significantly better and at time of my evaluation no requiring oxygen supplementation. -Continue to follow daily weights, strict I's and O's, low-sodium diet and IV Lasix. -He continues to express mild dyspnea on exertion.  Fine crackles appreciated on physical examination. -Cardiology service has been consulted and will follow any further recommendations in order to optimize his medication regimen. -Continue beta-blocker.  2-hyponatremia -Sodium in the 122 range in the setting of hypokalemia most likely. -Continue to follow electrolytes trend while diuresing him. -Continue telemetry monitoring.  3-atrial flutter -Rate controlled currently -Per patient condition is paroxysmal -Continue  Xarelto metoprolol -Continue telemetry monitoring.  4-history of alcohol misuse -Patient reports drinking 2-3 beers approximately 3 times a week; expressed never been drunk and not experiencing withdrawal. -For now we will continue monitoring.  No signs of withdrawal appreciated on examination. -Overall cessation counseling have been provided  5-gastroesophageal flux disease -Continue PPI.  DVT prophylaxis: Chronically on Xarelto Code Status: Full code. Family Communication: Father at bedside. Disposition:   Status is: Inpatient  Remains inpatient appropriate because: Still requiring IV diuresis for acute on chronic systolic heart failure. Patient is hemodynamically stable to be transferred to telemetry bed.   Consultants:  Cardiology service  Procedures:  See below for x-ray reports.  Antimicrobials: None   Subjective: Afebrile, no chest pain, reports no palpitations currently.  Still with dyspnea on exertion, but feeling better and no requiring oxygen supplementation.  Objective: Vitals:   05/05/21 1300 05/05/21 1320 05/05/21 1400 05/05/21 1500  BP: 93/62  101/69 108/74  Pulse: 81  86 87  Resp:   14 19  Temp:      TempSrc:      SpO2: 96% 99% 94% 95%  Weight:      Height:        Intake/Output Summary (Last 24 hours) at 05/05/2021 1601 Last data filed at 05/05/2021 0203 Gross per 24 hour  Intake --  Output 1550 ml  Net -1550 ml   Filed Weights   05/04/21 1851 05/05/21 1139  Weight: 81.6 kg 77.2 kg    Examination:  General exam: Appears more comfortable; currently no requiring oxygen supplementation.  Denies chest pain.  Still expressing dyspnea on exertion. Respiratory system: Decreased breath sounds at the bases, fine crackles, no wheezing, no using accessory muscles. Cardiovascular system: Rate controlled, no rubs, no gallops, no JVD. Gastrointestinal system: Abdomen  is nondistended, soft and nontender. No organomegaly or masses felt. Normal bowel  sounds heard. Central nervous system: Alert and oriented. No focal neurological deficits. Extremities: No cyanosis or clubbing. Skin: No petechiae. Psychiatry: Judgement and insight appear normal. Mood & affect appropriate.     Data Reviewed: I have personally reviewed following labs and imaging studies  CBC: Recent Labs  Lab 05/04/21 1918 05/05/21 0142  WBC 10.8* 9.9  NEUTROABS  --  9.3*  HGB 11.1* 12.1*  HCT 33.2* 36.2*  MCV 88.3 85.0  PLT 441* 425*    Basic Metabolic Panel: Recent Labs  Lab 05/04/21 1918 05/05/21 0142  NA 122* 125*  K 4.0 4.1  CL 90* 89*  CO2 22 24  GLUCOSE 108* 143*  BUN 8 10  CREATININE 0.81 0.83  CALCIUM 8.4* 8.8*  MG  --  1.8    GFR: Estimated Creatinine Clearance: 112.4 mL/min (by C-G formula based on SCr of 0.83 mg/dL).  Liver Function Tests: Recent Labs  Lab 05/04/21 1918 05/05/21 0142  AST 25 24  ALT 27 27  ALKPHOS 70 75  BILITOT 0.1* 0.5  PROT 7.0 7.4  ALBUMIN 3.6 3.7    CBG: No results for input(s): GLUCAP in the last 168 hours.   Recent Results (from the past 240 hour(s))  Resp Panel by RT-PCR (Flu A&B, Covid) Nasopharyngeal Swab     Status: None   Collection Time: 05/04/21  9:30 PM   Specimen: Nasopharyngeal Swab; Nasopharyngeal(NP) swabs in vial transport medium  Result Value Ref Range Status   SARS Coronavirus 2 by RT PCR NEGATIVE NEGATIVE Final    Comment: (NOTE) SARS-CoV-2 target nucleic acids are NOT DETECTED.  The SARS-CoV-2 RNA is generally detectable in upper respiratory specimens during the acute phase of infection. The lowest concentration of SARS-CoV-2 viral copies this assay can detect is 138 copies/mL. A negative result does not preclude SARS-Cov-2 infection and should not be used as the sole basis for treatment or other patient management decisions. A negative result may occur with  improper specimen collection/handling, submission of specimen other than nasopharyngeal swab, presence of viral  mutation(s) within the areas targeted by this assay, and inadequate number of viral copies(<138 copies/mL). A negative result must be combined with clinical observations, patient history, and epidemiological information. The expected result is Negative.  Fact Sheet for Patients:  EntrepreneurPulse.com.au  Fact Sheet for Healthcare Providers:  IncredibleEmployment.be  This test is no t yet approved or cleared by the [place] FDA and  has been authorized for detection and/or diagnosis of SARS-CoV-2 by FDA under an Emergency Use Authorization (EUA). This EUA will remain  in effect (meaning this test can be used) for the duration of the COVID-19 declaration under Section 564(b)(1) of the Act, 21 U.S.C.section 360bbb-3(b)(1), unless the authorization is terminated  or revoked sooner.       Influenza A by PCR NEGATIVE NEGATIVE Final   Influenza B by PCR NEGATIVE NEGATIVE Final    Comment: (NOTE) The Xpert Xpress SARS-CoV-2/FLU/RSV plus assay is intended as an aid in the diagnosis of influenza from Nasopharyngeal swab specimens and should not be used as a sole basis for treatment. Nasal washings and aspirates are unacceptable for Xpert Xpress SARS-CoV-2/FLU/RSV testing.  Fact Sheet for Patients: EntrepreneurPulse.com.au  Fact Sheet for Healthcare Providers: IncredibleEmployment.be  This test is not yet approved or cleared by the [place] FDA and has been authorized for detection and/or diagnosis of SARS-CoV-2 by FDA under an Emergency Use Authorization (EUA). This EUA  will remain in effect (meaning this test can be used) for the duration of the COVID-19 declaration under Section 564(b)(1) of the Act, 21 U.S.C. section 360bbb-3(b)(1), unless the authorization is terminated or revoked.  Performed at [facility], [address], [place], [place] [address]       Radiology Studies: CT Angio Chest  Pulmonary Embolism (PE) W or WO Contrast  Result Date: 05/04/2021 CLINICAL DATA:  Patient complains of shortness of breath and some chest discomfort. EXAM: CT ANGIOGRAPHY CHEST WITH CONTRAST TECHNIQUE: Multidetector CT imaging of the chest was performed using the standard protocol during bolus administration of intravenous contrast. Multiplanar CT image reconstructions and MIPs were obtained to evaluate the vascular anatomy. CONTRAST:  Intravenous contrast administered. COMPARISON:  Chest x-ray 05/04/2021 FINDINGS: Cardiovascular: Satisfactory opacification of the pulmonary arteries to the segmental level. No evidence of pulmonary embolism. The main pulmonary artery is enlarged in caliber measuring to 3.3 cm. Normal heart size. No significant pericardial effusion. The thoracic aorta is normal in caliber. At least mild atherosclerotic plaque of the thoracic aorta. Four-vessel coronary artery calcifications. Mediastinum/Nodes: No enlarged mediastinal, hilar, or axillary lymph nodes. Thyroid gland, trachea, and esophagus demonstrate no significant findings. Lungs/Pleura: Interlobular septal wall thickening within bilateral upper lobes. Peribronchovascular ground-glass airspace opacities which is most prominent within the bilateral upper lobes. No focal consolidation. Few scattered pulmonary micronodules in the right lung. No pulmonary mass. Bilateral trace pleural effusions. No pneumothorax. Debris noted within the trachea and right mainstem bronchi. Upper Abdomen: No acute abnormality. Musculoskeletal: No chest wall abnormality. No suspicious lytic or blastic osseous lesions. No acute displaced fracture. Review of the MIP images confirms the above findings. IMPRESSION: 1. No pulmonary embolus. 2. Pulmonary edema with bilateral trace pleural effusions. Superimposed infection/inflammation not excluded with peribronchovascular ground-glass airspace opacities. 3. Debris noted within the trachea and right mainstem  bronchi. Electronically Signed   By: Iven Finn M.D.   On: 05/04/2021 23:05   DG Chest Port 1 View  Result Date: 05/04/2021 CLINICAL DATA:  Provided history: Shortness of breath. Additional history provided: History of atrial flutter, hypertension, current smoker. EXAM: PORTABLE CHEST 1 VIEW COMPARISON:  Prior chest radiographs 04/24/2021 and earlier. FINDINGS: Heart size at the upper limits of normal, unchanged. No appreciable airspace consolidation or pulmonary edema. No evidence of pleural effusion on this AP portable radiograph. No evidence of pneumothorax. No acute bony abnormality identified. Degenerative changes of the spine. IMPRESSION: No evidence of acute cardiopulmonary abnormality. Electronically Signed   By: Kellie Simmering D.O.   On: 05/04/2021 19:43     Scheduled Meds:  albuterol  2.5 mg Nebulization Q6H   Chlorhexidine Gluconate Cloth  6 each Topical Daily   diphenhydrAMINE  50 mg Intravenous Once   furosemide  40 mg Intravenous Q12H   mouth rinse  15 mL Mouth Rinse BID   metoprolol succinate  75 mg Oral BID   pantoprazole  40 mg Oral Daily   rivaroxaban  20 mg Oral Q supper   Continuous Infusions:   LOS: 1 day    Time spent: 35 minutes    Barton Dubois, MD [facility]   To contact the attending provider between 7A-7P or the covering provider during after hours 7P-7A, please log into the web site [email or web address] and access using universal [place] password for that web site. If you do not have the password, please call the hospital operator.  05/05/2021, 4:01 PM
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
## 2021-05-05 NOTE — TOC Initial Note (Signed)
Transition of Care [facility]) - Initial/Assessment Note    Patient Details  Name: Bryan Pace MRN: [id number] Date of Birth: 1965-10-16  Transition of Care [facility]) CM/SW Contact:    Boneta Lucks, RN Phone Number: 05/05/2021, 1:34 PM  Clinical Narrative:      Patient admitted for acute respiratory failure with hypoxia. TOC consulted for CHF. Living better book ordered, RN gave to patient and wife.    CM reviewed daily weight, fluid restriction,and diet. Wife is upset and they are both overwhelmed. Patient brought up to unit. MD will check in on them this afternoon.             Expected Discharge Plan: Home/Self Care Barriers to Discharge: Continued Medical Work up   Patient Goals and CMS Choice Patient states their goals for this hospitalization and ongoing recovery are:: to go home, CMS Medicare.gov Compare Post Acute Care list provided to:: Patient Represenative (must comment) Choice offered to / list presented to : Spouse  Expected Discharge Plan and Services Expected Discharge Plan: Home/Self Care      Living arrangements for the past 2 months: Single Family Home                 Prior Living Arrangements/Services Living arrangements for the past 2 months: [place] Lives with:: Spouse Patient language and need for interpreter reviewed:: Yes        Need for Family Participation in Patient Care: Yes (Comment) Care giver support system in place?: Yes (comment)   Criminal Activity/Legal Involvement Pertinent to Current Situation/Hospitalization: No - Comment as needed  Activities of Daily Living Home Assistive Devices/Equipment: Eyeglasses ADL Screening (condition at time of admission) Patient's cognitive ability adequate to safely complete daily activities?: Yes Is the patient deaf or have difficulty hearing?: No Does the patient have difficulty seeing, even when wearing glasses/contacts?: No Does the patient have difficulty concentrating, remembering, or  making decisions?: No Patient able to express need for assistance with ADLs?: No Does the patient have difficulty dressing or bathing?: No Independently performs ADLs?: Yes (appropriate for developmental age) Does the patient have difficulty walking or climbing stairs?: No Weakness of Legs: None Weakness of Arms/Hands: None  Permission Sought/Granted    Emotional Assessment     Affect (typically observed): Agitated Orientation: : Oriented to Self, Oriented to Place, Oriented to  Time, Oriented to Situation Alcohol / Substance Use: Not Applicable    Admission diagnosis:  Acute respiratory failure with hypoxia ([place]) [W65.68] Systolic congestive heart failure, unspecified HF chronicity (H[facility]) [I50.20] Patient Active Problem List   Diagnosis Date Noted   Acute respiratory failure with hypoxia ([place]) 12/75/1700   Acute systolic CHF (congestive heart failure) ([place]) 04/24/2021   Atrial flutter, paroxysmal ([place]) 04/23/2021   HTN (hypertension) 04/23/2021   Hyponatremia 04/23/2021   Tobacco abuse 04/23/2021   Alcohol abuse 04/23/2021   Typical atrial flutter ([place]) 04/23/2021   Abdominal pain, other specified site 10/05/2012   PCP:  Celene Squibb, MD Pharmacy:   [id number] - [place], [address] [place] [address] Phone: [phone] Fax: [phone]   Readmission Risk Interventions Readmission Risk Prevention Plan 05/05/2021  Medication Screening Complete  Transportation Screening Complete  Some recent data might be hidden

## 2021-05-05 NOTE — ED Notes (Signed)
Trying on 6 liter HFNC -- saturation 96.

## 2021-05-06 DIAGNOSIS — E871 Hypo-osmolality and hyponatremia: Secondary | ICD-10-CM | POA: Diagnosis not present

## 2021-05-06 DIAGNOSIS — I5021 Acute systolic (congestive) heart failure: Secondary | ICD-10-CM | POA: Diagnosis not present

## 2021-05-06 DIAGNOSIS — F1090 Alcohol use, unspecified, uncomplicated: Secondary | ICD-10-CM | POA: Diagnosis not present

## 2021-05-06 DIAGNOSIS — I483 Typical atrial flutter: Secondary | ICD-10-CM | POA: Diagnosis not present

## 2021-05-06 MED ORDER — ALBUTEROL SULFATE (2.5 MG/3ML) 0.083% IN NEBU
2.5000 mg | INHALATION_SOLUTION | Freq: Two times a day (BID) | RESPIRATORY_TRACT | Status: DC
  Administered 2021-05-06 – 2021-05-07 (×2): 2.5 mg via RESPIRATORY_TRACT
  Filled 2021-05-06 (×2): qty 3

## 2021-05-06 MED ORDER — ALBUTEROL SULFATE (2.5 MG/3ML) 0.083% IN NEBU: 2.5000 mg | INHALATION_SOLUTION | Freq: Four times a day (QID) | RESPIRATORY_TRACT | Status: DC | PRN

## 2021-05-06 MED ORDER — FUROSEMIDE 40 MG PO TABS
40.0000 mg | ORAL_TABLET | Freq: Every day | ORAL | Status: DC
  Administered 2021-05-07: 40 mg via ORAL
  Filled 2021-05-06: qty 1

## 2021-05-06 NOTE — Progress Notes (Signed)
Progress Note  Patient Name: Bryan Pace Date of Encounter: 05/06/2021  Primary Cardiologist: Carlyle Dolly, MD  Subjective   Feels much better today.  No palpitations or chest pain.  Inpatient Medications    Scheduled Meds:  albuterol  2.5 mg Nebulization BID   Chlorhexidine Gluconate Cloth  6 each Topical Daily   digoxin  0.125 mg Oral Daily   diphenhydrAMINE  50 mg Intravenous Once   furosemide  40 mg Intravenous Q12H   mouth rinse  15 mL Mouth Rinse BID   metoprolol succinate  75 mg Oral BID   pantoprazole  40 mg Oral Daily   rivaroxaban  20 mg Oral Q supper   spironolactone  12.5 mg Oral Daily    PRN Meds: acetaminophen **OR** acetaminophen, albuterol, ALPRAZolam, morphine injection, ondansetron **OR** ondansetron (ZOFRAN) IV, oxyCODONE   Vital Signs    Vitals:   05/06/21 0754 05/06/21 0800 05/06/21 0911 05/06/21 0912  BP:    (!) 103/59  Pulse:   93 93  Resp:      Temp:      TempSrc:      SpO2: 100% 100%    Weight:      Height:       No intake or output data in the 24 hours ending 05/06/21 [place]   05/04/21 1851 05/05/21 1139 05/06/21 0500  Weight: 81.6 kg 77.2 kg 75.6 kg    Telemetry    Sinus rhythm with PACs.  Personally reviewed.  ECG    An ECG dated 05/05/2021 was personally reviewed today and demonstrated:  Sinus rhythm with PACs.  Physical Exam   GEN: No acute distress.   Neck: No JVD. Cardiac: RRR with ectopy, no gallop.  Respiratory: Nonlabored. Clear to auscultation bilaterally. GI: Soft, nontender, bowel sounds present. MS: No edema; No deformity. Neuro:  Nonfocal. Psych: Alert and oriented x 3. Normal affect.  Labs    Chemistry Recent Labs  Lab 05/04/21 1918 05/05/21 0142  NA 122* 125*  K 4.0 4.1  CL 90* 89*  CO2 22 24  GLUCOSE 108* 143*  BUN 8 10  CREATININE 0.81 0.83  CALCIUM 8.4* 8.8*  PROT 7.0 7.4  ALBUMIN 3.6 3.7  AST 25 24  ALT 27 27  ALKPHOS 70 75  BILITOT 0.1* 0.5  GFRNONAA >60 >60   ANIONGAP 10 12     Hematology Recent Labs  Lab 05/04/21 1918 05/05/21 0142  WBC 10.8* 9.9  RBC 3.76* 4.26  HGB 11.1* 12.1*  HCT 33.2* 36.2*  MCV 88.3 85.0  MCH 29.5 28.4  MCHC 33.4 33.4  RDW 14.7 14.6  PLT 441* 425*    Cardiac Enzymes Recent Labs  Lab 04/23/21 1154 04/23/21 1331 05/04/21 1918 05/04/21 2130  TROPONINIHS 40* 36* 14 18*    BNP Recent Labs  Lab 05/04/21 1918  BNP 1,035.0*     DDimer Recent Labs  Lab 05/04/21 1918  DDIMER 1.02*     Radiology    CT Angio Chest Pulmonary Embolism (PE) W or WO Contrast  Result Date: 05/04/2021 CLINICAL DATA:  Patient complains of shortness of breath and some chest discomfort. EXAM: CT ANGIOGRAPHY CHEST WITH CONTRAST TECHNIQUE: Multidetector CT imaging of the chest was performed using the standard protocol during bolus administration of intravenous contrast. Multiplanar CT image reconstructions and MIPs were obtained to evaluate the vascular anatomy. CONTRAST:  Intravenous contrast administered. COMPARISON:  Chest x-ray 05/04/2021 FINDINGS: Cardiovascular: Satisfactory opacification of the pulmonary arteries to the segmental level. No  evidence of pulmonary embolism. The main pulmonary artery is enlarged in caliber measuring to 3.3 cm. Normal heart size. No significant pericardial effusion. The thoracic aorta is normal in caliber. At least mild atherosclerotic plaque of the thoracic aorta. Four-vessel coronary artery calcifications. Mediastinum/Nodes: No enlarged mediastinal, hilar, or axillary lymph nodes. Thyroid gland, trachea, and esophagus demonstrate no significant findings. Lungs/Pleura: Interlobular septal wall thickening within bilateral upper lobes. Peribronchovascular ground-glass airspace opacities which is most prominent within the bilateral upper lobes. No focal consolidation. Few scattered pulmonary micronodules in the right lung. No pulmonary mass. Bilateral trace pleural effusions. No pneumothorax. Debris  noted within the trachea and right mainstem bronchi. Upper Abdomen: No acute abnormality. Musculoskeletal: No chest wall abnormality. No suspicious lytic or blastic osseous lesions. No acute displaced fracture. Review of the MIP images confirms the above findings. IMPRESSION: 1. No pulmonary embolus. 2. Pulmonary edema with bilateral trace pleural effusions. Superimposed infection/inflammation not excluded with peribronchovascular ground-glass airspace opacities. 3. Debris noted within the trachea and right mainstem bronchi. Electronically Signed   By: Iven Finn M.D.   On: 05/04/2021 23:05   DG Chest Port 1 View  Result Date: 05/04/2021 CLINICAL DATA:  Provided history: Shortness of breath. Additional history provided: History of atrial flutter, hypertension, current smoker. EXAM: PORTABLE CHEST 1 VIEW COMPARISON:  Prior chest radiographs 04/24/2021 and earlier. FINDINGS: Heart size at the upper limits of normal, unchanged. No appreciable airspace consolidation or pulmonary edema. No evidence of pleural effusion on this AP portable radiograph. No evidence of pneumothorax. No acute bony abnormality identified. Degenerative changes of the spine. IMPRESSION: No evidence of acute cardiopulmonary abnormality. Electronically Signed   By: Kellie Simmering D.O.   On: 05/04/2021 19:43    Cardiac Studies   TEE 04/28/2021:  1. Left ventricular ejection fraction, by estimation, is <20%. The left  ventricle has severely decreased function. The left ventricle demonstrates  global hypokinesis. The left ventricular internal cavity size was mildly  dilated.   2. Right ventricular systolic function is normal. The right ventricular  size is normal.   3. No left atrial/left atrial appendage thrombus was detected.   4. The mitral valve is normal in structure. Mild mitral valve  regurgitation. No evidence of mitral stenosis.   5. The aortic valve is tricuspid. Aortic valve regurgitation is trivial.  No aortic  stenosis is present.   6. There is mild (Grade II) plaque involving the descending aorta.  Assessment & Plan    1.  HFrEF with acute systolic heart failure.  Symptomatically improving with diuresis and medications being adjusted.  2.  Typical atrial flutter status post recent successful TEE guided cardioversion on October 24.  He is on Xarelto for stroke prophylaxis and remains in sinus rhythm.  3.  Hyponatremia, sodium relatively stable.  4.  Regular alcohol intake, not clearly to excess per discussion.  No signs of withdrawal.  Transfer to telemetry and increase activity, need to ambulate and assess symptoms.  We will stop IV Lasix and convert back to oral regimen.  Continue Toprol-XL and Xarelto.  Also started on Aldactone and digoxin.  Given current blood pressure would hold off on initiation of ARB, ANRI, SGLT2 inhibitor for now.  Signed, Rozann Lesches, MD  05/06/2021, 9:42 AM

## 2021-05-06 NOTE — Progress Notes (Signed)
PROGRESS NOTE    Bryan Pace  LEX:[id number] DOB: [birth year] DOA: 05/04/2021 PCP: Celene Squibb, MD   Chief Complaint  Patient presents with   Chest Pain    Brief Narrative:  As per H&P written by Dr. Clearence Ped on 05/04/21; but briefly 55 year old male with significant history of atrial flutter, global hypokinesis and systolic heart failure (ejection fraction 25%) who presented to the hospital complaining of shortness of breath and increased dyspnea.  Work-up demonstrated pulmonary edema, vascular congestion and trace bilateral pleural effusion.  Patient in the requiring to be on BiPAP at time of admission to stabilize his breathing and TRH was contacted to place in the hospital for further evaluation and management of CHF exacerbation.  Assessment & Plan: 1-acute respiratory failure with hypoxia secondary to acute on chronic systolic heart failure. -On presentation patient was wheezing, complaining of dyspnea on exertion and in the requiring to be placed on BiPAP. -CT chest to rule out the presence of pulmonary embolism but has confirmed vascular congestion, pulmonary edema and trace bilateral pleural effusion. -Excellent response to the use of BiPAP and IV diuresis.  Currently with good saturation on room air. -Continue low-sodium diet, daily weights and titrate as allowed. -Diuretics will be transition to oral route and physical activity will be increased. -Reported no orthopnea; still slightly short of breath with activity but no requiring oxygen supplementation and feeling significantly improved. -Appreciate cardiology service assistance and recommendations of optimizing patient's medication. -Continue beta-blocker and digoxin. -Patient also started on Aldactone 12.5 mg -No using ARB, ANRI or SGLT2 in the setting of soft blood pressure.  2-hyponatremia -Sodium in the 122 range in the setting of hypokalemia most likely. -Continue to follow electrolytes trend while diuresing  him. -Continue telemetry monitoring.  3-paroxysmal atrial flutter -Rate controlled and currently sinus rhythm with PACs. -Patient is status post TEE cardioversion on April 28, 2021.   -Continue Xarelto and metoprolol -Patient has also been started on digoxin per cardiology recommendations. -Continue telemetry monitoring and increase physical activity.  We will.  4-history of alcohol misuse -Patient reports drinking 2-3 beers approximately 3 times a week; expressed never been drunk and not experiencing withdrawal. -For now we will continue monitoring.  No signs of withdrawal appreciated on examination. -Overall cessation counseling have been provided  5-gastroesophageal flux disease -Continue PPI.  DVT prophylaxis: Chronically on Xarelto Code Status: Full code. Family Communication: No family at bedside during today's evaluation. Disposition:   Status is: Inpatient  Remains inpatient appropriate because: Significantly improved; safe to transfer to telemetry bed and increase physical activity.  Cardiology service recommending transition of IV diuresis to oral route and continue optimizing medications.  Hopefully discharge home in the next 24 hours.   Consultants:  Cardiology service  Procedures:  See below for x-ray reports.  Antimicrobials: None   Subjective: Afebrile, no chest pain, no palpitations, no nausea, no vomiting.  Reports significant improvement in his breathing; good urine output reported.  Objective: Vitals:   05/06/21 1000 05/06/21 1100 05/06/21 1119 05/06/21 1200  BP: 101/71 113/79  105/85  Pulse: 85 85 90 81  Resp:      Temp:   98.7 F (37.1 C)   TempSrc:   Oral   SpO2: 100% 95% 99% 100%  Weight:      Height:        Intake/Output Summary (Last 24 hours) at 05/06/2021 1344 Last data filed at 05/06/2021 1100 Gross per 24 hour  Intake --  Output 700 ml  Net -700  ml   Filed Weights   05/04/21 1851 05/05/21 1139 05/06/21 0500  Weight: 81.6 kg  77.2 kg 75.6 kg    Examination: General exam: Alert, awake, oriented x 3; reports no chest pain, no palpitations, no nausea or vomiting. Respiratory system: Decreased breath sounds at the bases; no wheezing, no using accessory muscle.  Good saturation on room air. Cardiovascular system: Currently rate controlled in sinus; no JVD, no rubs, no gallops.  Gastrointestinal system: Abdomen is nondistended, soft and nontender. No organomegaly or masses felt. Normal bowel sounds heard. Central nervous system: Alert and oriented. No focal neurological deficits. Extremities: No cyanosis or clubbing.  No lower extremity edema appreciated. Skin: No rashes or petechiae. Psychiatry: Judgement and insight appear normal. Mood & affect appropriate.    Data Reviewed: I have personally reviewed following labs and imaging studies  CBC: Recent Labs  Lab 05/04/21 1918 05/05/21 0142  WBC 10.8* 9.9  NEUTROABS  --  9.3*  HGB 11.1* 12.1*  HCT 33.2* 36.2*  MCV 88.3 85.0  PLT 441* 425*    Basic Metabolic Panel: Recent Labs  Lab 05/04/21 1918 05/05/21 0142  NA 122* 125*  K 4.0 4.1  CL 90* 89*  CO2 22 24  GLUCOSE 108* 143*  BUN 8 10  CREATININE 0.81 0.83  CALCIUM 8.4* 8.8*  MG  --  1.8    GFR: Estimated Creatinine Clearance: 110.1 mL/min (by C-G formula based on SCr of 0.83 mg/dL).  Liver Function Tests: Recent Labs  Lab 05/04/21 1918 05/05/21 0142  AST 25 24  ALT 27 27  ALKPHOS 70 75  BILITOT 0.1* 0.5  PROT 7.0 7.4  ALBUMIN 3.6 3.7    CBG: No results for input(s): GLUCAP in the last 168 hours.   Recent Results (from the past 240 hour(s))  Resp Panel by RT-PCR (Flu A&B, Covid) Nasopharyngeal Swab     Status: None   Collection Time: 05/04/21  9:30 PM   Specimen: Nasopharyngeal Swab; Nasopharyngeal(NP) swabs in vial transport medium  Result Value Ref Range Status   SARS Coronavirus 2 by RT PCR NEGATIVE NEGATIVE Final    Comment: (NOTE) SARS-CoV-2 target nucleic acids are NOT  DETECTED.  The SARS-CoV-2 RNA is generally detectable in upper respiratory specimens during the acute phase of infection. The lowest concentration of SARS-CoV-2 viral copies this assay can detect is 138 copies/mL. A negative result does not preclude SARS-Cov-2 infection and should not be used as the sole basis for treatment or other patient management decisions. A negative result may occur with  improper specimen collection/handling, submission of specimen other than nasopharyngeal swab, presence of viral mutation(s) within the areas targeted by this assay, and inadequate number of viral copies(<138 copies/mL). A negative result must be combined with clinical observations, patient history, and epidemiological information. The expected result is Negative.  Fact Sheet for Patients:  EntrepreneurPulse.com.au  Fact Sheet for Healthcare Providers:  IncredibleEmployment.be  This test is no t yet approved or cleared by the [place] FDA and  has been authorized for detection and/or diagnosis of SARS-CoV-2 by FDA under an Emergency Use Authorization (EUA). This EUA will remain  in effect (meaning this test can be used) for the duration of the COVID-19 declaration under Section 564(b)(1) of the Act, 21 U.S.C.section 360bbb-3(b)(1), unless the authorization is terminated  or revoked sooner.       Influenza A by PCR NEGATIVE NEGATIVE Final   Influenza B by PCR NEGATIVE NEGATIVE Final    Comment: (NOTE) The Xpert Xpress  SARS-CoV-2/FLU/RSV plus assay is intended as an aid in the diagnosis of influenza from Nasopharyngeal swab specimens and should not be used as a sole basis for treatment. Nasal washings and aspirates are unacceptable for Xpert Xpress SARS-CoV-2/FLU/RSV testing.  Fact Sheet for Patients: EntrepreneurPulse.com.au  Fact Sheet for Healthcare Providers: IncredibleEmployment.be  This test is not yet  approved or cleared by the [place] FDA and has been authorized for detection and/or diagnosis of SARS-CoV-2 by FDA under an Emergency Use Authorization (EUA). This EUA will remain in effect (meaning this test can be used) for the duration of the COVID-19 declaration under Section 564(b)(1) of the Act, 21 U.S.C. section 360bbb-3(b)(1), unless the authorization is terminated or revoked.  Performed at [facility], [address] [place], [place] [address]      Radiology Studies: CT Angio Chest Pulmonary Embolism (PE) W or WO Contrast  Result Date: 05/04/2021 CLINICAL DATA:  Patient complains of shortness of breath and some chest discomfort. EXAM: CT ANGIOGRAPHY CHEST WITH CONTRAST TECHNIQUE: Multidetector CT imaging of the chest was performed using the standard protocol during bolus administration of intravenous contrast. Multiplanar CT image reconstructions and MIPs were obtained to evaluate the vascular anatomy. CONTRAST:  Intravenous contrast administered. COMPARISON:  Chest x-ray 05/04/2021 FINDINGS: Cardiovascular: Satisfactory opacification of the pulmonary arteries to the segmental level. No evidence of pulmonary embolism. The main pulmonary artery is enlarged in caliber measuring to 3.3 cm. Normal heart size. No significant pericardial effusion. The thoracic aorta is normal in caliber. At least mild atherosclerotic plaque of the thoracic aorta. Four-vessel coronary artery calcifications. Mediastinum/Nodes: No enlarged mediastinal, hilar, or axillary lymph nodes. Thyroid gland, trachea, and esophagus demonstrate no significant findings. Lungs/Pleura: Interlobular septal wall thickening within bilateral upper lobes. Peribronchovascular ground-glass airspace opacities which is most prominent within the bilateral upper lobes. No focal consolidation. Few scattered pulmonary micronodules in the right lung. No pulmonary mass. Bilateral trace pleural effusions. No pneumothorax. Debris noted  within the trachea and right mainstem bronchi. Upper Abdomen: No acute abnormality. Musculoskeletal: No chest wall abnormality. No suspicious lytic or blastic osseous lesions. No acute displaced fracture. Review of the MIP images confirms the above findings. IMPRESSION: 1. No pulmonary embolus. 2. Pulmonary edema with bilateral trace pleural effusions. Superimposed infection/inflammation not excluded with peribronchovascular ground-glass airspace opacities. 3. Debris noted within the trachea and right mainstem bronchi. Electronically Signed   By: Iven Finn M.D.   On: 05/04/2021 23:05   DG Chest Port 1 View  Result Date: 05/04/2021 CLINICAL DATA:  Provided history: Shortness of breath. Additional history provided: History of atrial flutter, hypertension, current smoker. EXAM: PORTABLE CHEST 1 VIEW COMPARISON:  Prior chest radiographs 04/24/2021 and earlier. FINDINGS: Heart size at the upper limits of normal, unchanged. No appreciable airspace consolidation or pulmonary edema. No evidence of pleural effusion on this AP portable radiograph. No evidence of pneumothorax. No acute bony abnormality identified. Degenerative changes of the spine. IMPRESSION: No evidence of acute cardiopulmonary abnormality. Electronically Signed   By: Kellie Simmering D.O.   On: 05/04/2021 19:43     Scheduled Meds:  albuterol  2.5 mg Nebulization BID   Chlorhexidine Gluconate Cloth  6 each Topical Daily   digoxin  0.125 mg Oral Daily   diphenhydrAMINE  50 mg Intravenous Once   [START ON 05/07/2021] furosemide  40 mg Oral Daily   mouth rinse  15 mL Mouth Rinse BID   metoprolol succinate  75 mg Oral BID   pantoprazole  40 mg Oral Daily  rivaroxaban  20 mg Oral Q supper   spironolactone  12.5 mg Oral Daily   Continuous Infusions:   LOS: 2 days    Time spent: 35 minutes   Barton Dubois, MD [facility]   To contact the attending provider between 7A-7P or the covering provider during after hours 7P-7A,  please log into the web site [email or web address] and access using universal [place] password for that web site. If you do not have the password, please call the hospital operator.  05/06/2021, 1:44 PM

## 2021-05-07 MED ORDER — METOPROLOL SUCCINATE ER 50 MG PO TB24: 100.0000 mg | ORAL_TABLET | Freq: Two times a day (BID) | ORAL | Status: DC

## 2021-05-07 MED ORDER — DIGOXIN 125 MCG PO TABS
0.1250 mg | ORAL_TABLET | Freq: Every day | ORAL | 1 refills | Status: DC
Start: 2021-05-08 — End: 2021-07-22

## 2021-05-07 MED ORDER — SPIRONOLACTONE 25 MG PO TABS: 12.5000 mg | ORAL_TABLET | Freq: Every day | ORAL | 1 refills | Status: DC

## 2021-05-07 MED ORDER — METOPROLOL SUCCINATE ER 100 MG PO TB24: 100.0000 mg | ORAL_TABLET | Freq: Two times a day (BID) | ORAL | 1 refills | Status: DC

## 2021-05-07 NOTE — Discharge Summary (Addendum)
Physician Discharge Summary  Bryan Pace YDX:[id number] DOB: Aug 05, 1965 DOA: 05/04/2021  PCP: Celene Squibb, MD  Admit date: 05/04/2021 Discharge date: 05/07/2021  Admitted From: Home Disposition:  Home  Recommendations for Outpatient Follow-up:  Follow up with PCP in 1-2 weeks Please obtain BMP/CBC in one week    Discharge Condition: Stable CODE STATUS:Home Diet recommendation: Heart Healthy   Brief/Interim Summary: 55 year old male with a history of hypertension, alcohol abuse, tobacco abuse, GERD, atrial flutter s/p DCCV 72/09/47, systolic CHF  presented to the hospital complaining of shortness of breath and increased dyspnea.  Work-up demonstrated pulmonary edema, vascular congestion and trace bilateral pleural effusion.  Patient in the requiring to be on BiPAP at time of admission to stabilize his breathing and TR[facility] was contacted to place in the hospital for further evaluation and management of CHF exacerbation.  He initially required BiPAP and was started on IV lasix.  Cardiology was consulted to assist.  The patient clinically improved and was weaned to RA.   Discharge Diagnoses:  acute respiratory failure with hypoxia secondary to acute on chronic systolic heart failure. -On presentation patient was wheezing, complaining of dyspnea on exertion and in the requiring to be placed on BiPAP. -11/2--now stable on RA -CTA chest to rule out the presence of pulmonary embolism but has confirmed vascular congestion, pulmonary edema and trace bilateral pleural effusion--No PE -Excellent response to the use of BiPAP and IV diuresis.   -Continue low-sodium diet, daily weights and titrate as allowed. -Diuretics will be transition to oral route and physical activity will be increased. -Reported no orthopnea; still slightly short of breath with activity but no requiring oxygen supplementation and feeling significantly improved. -Appreciate cardiology service assistance and  recommendations of optimizing patient's medication. -Continue beta-blocker and digoxin. -metoprolol succinate increased to bid -Patient also started on Aldactone 12.5 mg -No using ARB, ANRI or SGLT2 in the setting of soft blood pressure.   hyponatremia -Sodium in the 122 range initially -has chronic hyponatremia due to CHF -Continue to follow electrolytes trend while diuresing him. -clinically asymptomatic   paroxysmal atrial flutter -Rate controlled and currently sinus rhythm with PACs. -Patient is status post TEE cardioversion on April 28, 2021.   -Continue Xarelto and metoprolol -Patient has also been started on digoxin per cardiology recommendations. -Continue telemetry monitoring and increase physical activity.   -remains in sinus   history of alcohol misuse -Patient reports drinking 2-3 beers approximately 3 times a week; expressed never been drunk and not experiencing withdrawal. -For now we will continue monitoring.  No signs of withdrawal appreciated on examination. -Overall cessation counseling have been provided   gastroesophageal flux disease -Continue PPI.   Discharge Instructions   Allergies as of 05/07/2021       Reactions   Amiodarone Other (See Comments)   Flushing, shortness of breath        Medication List     TAKE these medications    ALPRAZolam 1 MG tablet Commonly known as: XANAX Take 1 mg by mouth at bedtime as needed for sleep.   digoxin 0.125 MG tablet Commonly known as: LANOXIN Take 1 tablet (0.125 mg total) by mouth daily. Start taking on: May 08, 2021   furosemide 40 MG tablet Commonly known as: LASIX Take 1 tablet (40 mg total) by mouth daily.   metoprolol succinate 100 MG 24 hr tablet Commonly known as: TOPROL-XL Take 1 tablet (100 mg total) by mouth 2 (two) times daily. Take with or immediately following a meal. What  changed:  medication strength how much to take   omeprazole 40 MG capsule Commonly known as:  PRILOSEC Take 40 mg by mouth daily.   rivaroxaban 20 MG Tabs tablet Commonly known as: Xarelto Take 1 tablet (20 mg total) by mouth daily with supper.   spironolactone 25 MG tablet Commonly known as: ALDACTONE Take 0.5 tablets (12.5 mg total) by mouth daily.        Follow-up Information     Evans Lance, MD Follow up on 05/13/2021.   Specialty: Cardiology Why: Keep scheduled follow-up for 05/13/2021 at 9:30 AM. Contact information: [place] [address] [phone]         Arnoldo Lenis, MD Follow up on 05/23/2021.   Specialty: Cardiology Why: Keep scheduled follow-up for 05/23/2021 at 8:40 AM. Contact information: [place] [address] [phone]                Allergies  Allergen Reactions   Amiodarone Other (See Comments)    Flushing, shortness of breath     Consultations: cardiology   Procedures/Studies: DG Chest 2 View  Result Date: 04/24/2021 CLINICAL DATA:  Dyspnea EXAM: CHEST - 2 VIEW COMPARISON:  04/23/2021 FINDINGS: Stable cardiomediastinal contours. Increasing hazy opacity at the right lung base. Small layering bilateral pleural effusions. No pneumothorax. IMPRESSION: Small bilateral pleural effusions with increasing hazy opacity at the right lung base, atelectasis versus pneumonia. Electronically Signed   By: Davina Poke D.O.   On: 04/24/2021 07:39   CT Angio Chest Pulmonary Embolism (PE) W or WO Contrast  Result Date: 05/04/2021 CLINICAL DATA:  Patient complains of shortness of breath and some chest discomfort. EXAM: CT ANGIOGRAPHY CHEST WITH CONTRAST TECHNIQUE: Multidetector CT imaging of the chest was performed using the standard protocol during bolus administration of intravenous contrast. Multiplanar CT image reconstructions and MIPs were obtained to evaluate the vascular anatomy. CONTRAST:  Intravenous contrast administered. COMPARISON:  Chest x-ray 05/04/2021 FINDINGS: Cardiovascular:  Satisfactory opacification of the pulmonary arteries to the segmental level. No evidence of pulmonary embolism. The main pulmonary artery is enlarged in caliber measuring to 3.3 cm. Normal heart size. No significant pericardial effusion. The thoracic aorta is normal in caliber. At least mild atherosclerotic plaque of the thoracic aorta. Four-vessel coronary artery calcifications. Mediastinum/Nodes: No enlarged mediastinal, hilar, or axillary lymph nodes. Thyroid gland, trachea, and esophagus demonstrate no significant findings. Lungs/Pleura: Interlobular septal wall thickening within bilateral upper lobes. Peribronchovascular ground-glass airspace opacities which is most prominent within the bilateral upper lobes. No focal consolidation. Few scattered pulmonary micronodules in the right lung. No pulmonary mass. Bilateral trace pleural effusions. No pneumothorax. Debris noted within the trachea and right mainstem bronchi. Upper Abdomen: No acute abnormality. Musculoskeletal: No chest wall abnormality. No suspicious lytic or blastic osseous lesions. No acute displaced fracture. Review of the MIP images confirms the above findings. IMPRESSION: 1. No pulmonary embolus. 2. Pulmonary edema with bilateral trace pleural effusions. Superimposed infection/inflammation not excluded with peribronchovascular ground-glass airspace opacities. 3. Debris noted within the trachea and right mainstem bronchi. Electronically Signed   By: Iven Finn M.D.   On: 05/04/2021 23:05   DG Chest Port 1 View  Result Date: 05/04/2021 CLINICAL DATA:  Provided history: Shortness of breath. Additional history provided: History of atrial flutter, hypertension, current smoker. EXAM: PORTABLE CHEST 1 VIEW COMPARISON:  Prior chest radiographs 04/24/2021 and earlier. FINDINGS: Heart size at the upper limits of normal, unchanged. No appreciable airspace consolidation or pulmonary edema.  No evidence of pleural effusion on this AP portable  radiograph. No evidence of pneumothorax. No acute bony abnormality identified. Degenerative changes of the spine. IMPRESSION: No evidence of acute cardiopulmonary abnormality. Electronically Signed   By: Kellie Simmering D.O.   On: 05/04/2021 19:43   DG Chest Port 1 View  Result Date: 04/23/2021 CLINICAL DATA:  Shortness of breath EXAM: PORTABLE CHEST 1 VIEW COMPARISON:  Chest x-ray dated August 31, 2009 FINDINGS: Cardiac and mediastinal contours are within normal limits for AP technique. Mild bilateral interstitial opacities. No focal consolidation. No large pleural effusion or pneumothorax. IMPRESSION: Mild bilateral interstitial opacities, possibly due to pulmonary edema. Electronically Signed   By: Yetta Glassman M.D.   On: 04/23/2021 12:36   DG Chest Port 1V same Day  Result Date: 04/23/2021 CLINICAL DATA:  Shortness of breath and chest tightness. EXAM: PORTABLE CHEST 1 VIEW COMPARISON:  Chest x-ray 04/23/2021. FINDINGS: Heart size is upper limits of normal, unchanged. There is no focal lung consolidation, pleural effusion or pneumothorax. No acute fractures are seen. IMPRESSION: No active disease. Electronically Signed   By: Ronney Asters M.D.   On: 04/23/2021 19:37   ECHOCARDIOGRAM COMPLETE  Result Date: 04/23/2021    ECHOCARDIOGRAM REPORT   Patient Name:   Bryan Pace Date of Exam: 04/23/2021 Medical Rec #:  [id number]      Height:       72.0 in Accession #:    [id number]     Weight:       175.0 lb Date of Birth:  1965/08/22     BSA:          2.013 m Patient Age:    58 years       BP:           109/98 mmHg Patient Gender: M              HR:           112 bpm. Exam Location:  [facility] Procedure: 2D Echo, Cardiac Doppler and Color Doppler STAT ECHO Indications:    Abnormal ECG R94.31  History:        Patient has no prior history of Echocardiogram examinations.                 Arrythmias:Atrial Flutter; Risk Factors:Hypertension.  Sonographer:    Alvino Chapel RCS Referring Phys: [phone]  COURAGE EMOKPAE IMPRESSIONS  1. Left ventricular ejection fraction, by estimation, is approximately 20% in the setting of atrial flutter. The left ventricle has severely decreased function. The left ventricle demonstrates global hypokinesis. The left ventricular internal cavity size was mildly dilated. There is mild left ventricular hypertrophy. Left ventricular diastolic parameters are indeterminate.  2. Right ventricular systolic function is mildly reduced. The right ventricular size is normal. There is moderately elevated pulmonary artery systolic pressure. The estimated right ventricular systolic pressure is 66.4 mmHg.  3. Left atrial size was moderately dilated.  4. Right atrial size was mildly dilated.  5. The mitral valve is grossly normal. Mild to moderate mitral valve regurgitation.  6. Tricuspid valve regurgitation is moderate.  7. The aortic valve is tricuspid. Aortic valve regurgitation is not visualized.  8. The inferior vena cava is dilated in size with <50% respiratory variability, suggesting right atrial pressure of 15 mmHg. Comparison(s): No prior Echocardiogram. FINDINGS  Left Ventricle: Left ventricular ejection fraction, by estimation, is 20%. The left ventricle has severely decreased function. The left ventricle demonstrates global hypokinesis. The left ventricular internal cavity size  was mildly dilated. There is mild left ventricular hypertrophy. Left ventricular diastolic parameters are indeterminate. Right Ventricle: The right ventricular size is normal. No increase in right ventricular wall thickness. Right ventricular systolic function is mildly reduced. There is moderately elevated pulmonary artery systolic pressure. The tricuspid regurgitant velocity is 3.00 m/s, and with an assumed right atrial pressure of 15 mmHg, the estimated right ventricular systolic pressure is 40.0 mmHg. Left Atrium: Left atrial size was moderately dilated. Right Atrium: Right atrial size was mildly dilated.  Pericardium: There is no evidence of pericardial effusion. Presence of pericardial fat pad. Mitral Valve: The mitral valve is grossly normal. There is mild thickening of the mitral valve leaflet(s). Mild to moderate mitral valve regurgitation. Tricuspid Valve: The tricuspid valve is grossly normal. Tricuspid valve regurgitation is moderate. Aortic Valve: The aortic valve is tricuspid. There is mild aortic valve annular calcification. Aortic valve regurgitation is not visualized. Pulmonic Valve: The pulmonic valve was grossly normal. Pulmonic valve regurgitation is trivial. Aorta: The aortic root is normal in size and structure. Venous: The inferior vena cava is dilated in size with less than 50% respiratory variability, suggesting right atrial pressure of 15 mmHg. IAS/Shunts: No atrial level shunt detected by color flow Doppler.  LEFT VENTRICLE PLAX 2D LVIDd:         6.00 cm LVIDs:         5.20 cm LV PW:         1.10 cm LV IVS:        1.00 cm LVOT diam:     2.00 cm LV SV:         22 LV SV Index:   11 LVOT Area:     3.14 cm  RIGHT VENTRICLE TAPSE (M-mode): 1.4 cm LEFT ATRIUM              Index        RIGHT ATRIUM           Index LA diam:        5.00 cm  2.48 cm/m   RA Area:     21.10 cm LA Vol (A2C):   102.0 ml 50.67 ml/m  RA Volume:   64.10 ml  31.84 ml/m LA Vol (A4C):   93.0 ml  46.20 ml/m LA Biplane Vol: 101.0 ml 50.17 ml/m  AORTIC VALVE LVOT Vmax:   47.17 cm/s LVOT Vmean:  36.733 cm/s LVOT VTI:    0.070 m  AORTA Ao Root diam: 3.60 cm MITRAL VALVE               TRICUSPID VALVE MV Area (PHT): 5.02 cm    TR Peak grad:   36.0 mmHg MV Decel Time: 151 msec    TR Vmax:        300.00 cm/s MV E velocity: 97.70 cm/s                            SHUNTS                            Systemic VTI:  0.07 m                            Systemic Diam: 2.00 cm Rozann Lesches MD Electronically signed by Rozann Lesches MD Signature Date/Time: 04/23/2021/3:59:06 PM    Final    ECHO TEE  Result Date: 04/28/2021  TRANSESOPHOGEAL ECHO REPORT   Patient Name:   Bryan Pace Date of Exam: 04/28/2021 Medical Rec #:  [id number]      Height:       72.0 in Accession #:    [id number]     Weight:       169.1 lb Date of Birth:  04/09/1966     BSA:          1.984 m Patient Age:    50 years       BP:           108/89 mmHg Patient Gender: M              HR:           145 bpm. Exam Location:  Inpatient Procedure: Transesophageal Echo, Color Doppler and Cardiac Doppler Indications:     I48.92* Unspecified atrial flutter  History:         Patient has prior history of Echocardiogram examinations, most                  recent 04/23/2021. CHF, Arrythmias:Atrial Flutter; Risk                  Factors:Hypertension, Current Smoker and ETOH.  Sonographer:     Bernadene Person RDCS Referring Phys:  [id number] Erma Heritage Diagnosing Phys: Buford Dresser MD PROCEDURE: After discussion of the risks and benefits of a TEE, an informed consent was obtained from the patient. The transesophogeal probe was passed without difficulty through the esophogus of the patient. Local oropharyngeal anesthetic was provided with Cetacaine. Sedation performed by different physician. The patient was monitored while under deep sedation. Anesthestetic sedation was provided intravenously by Anesthesiology: 219mg  of Propofol. The patient's vital signs; including heart rate, blood  pressure, and oxygen saturation; remained stable throughout the procedure. The patient developed no complications during the procedure. A successful direct current cardioversion was performed at 120 joules with 1 attempt. IMPRESSIONS  1. Left ventricular ejection fraction, by estimation, is <20%. The left ventricle has severely decreased function. The left ventricle demonstrates global hypokinesis. The left ventricular internal cavity size was mildly dilated.  2. Right ventricular systolic function is normal. The right ventricular size is normal.  3. No left atrial/left atrial appendage  thrombus was detected.  4. The mitral valve is normal in structure. Mild mitral valve regurgitation. No evidence of mitral stenosis.  5. The aortic valve is tricuspid. Aortic valve regurgitation is trivial. No aortic stenosis is present.  6. There is mild (Grade II) plaque involving the descending aorta. Conclusion(s)/Recommendation(s): No LA/LAA thrombus identified. Successful cardioversion performed with restoration of normal sinus rhythm. FINDINGS  Left Ventricle: Left ventricular ejection fraction, by estimation, is <20%. The left ventricle has severely decreased function. The left ventricle demonstrates global hypokinesis. The left ventricular internal cavity size was mildly dilated. Right Ventricle: The right ventricular size is normal. No increase in right ventricular wall thickness. Right ventricular systolic function is normal. Left Atrium: Left atrial size was normal in size. No left atrial/left atrial appendage thrombus was detected. Right Atrium: Right atrial size was normal in size. Pericardium: There is no evidence of pericardial effusion. Mitral Valve: The mitral valve is normal in structure. Mild mitral valve regurgitation. No evidence of mitral valve stenosis. There is no evidence of mitral valve vegetation. Tricuspid Valve: The tricuspid valve is normal in structure. Tricuspid valve regurgitation is mild . No evidence of tricuspid stenosis. There is no evidence of tricuspid valve vegetation. Aortic Valve:  The aortic valve is tricuspid. Aortic valve regurgitation is trivial. No aortic stenosis is present. There is no evidence of aortic valve vegetation. Pulmonic Valve: The pulmonic valve was grossly normal. Pulmonic valve regurgitation is trivial. No evidence of pulmonic stenosis. There is no evidence of pulmonic valve vegetation. Aorta: The aortic root and ascending aorta are structurally normal, with no evidence of dilitation. There is mild (Grade II) plaque involving the descending aorta.  IAS/Shunts: No atrial level shunt detected by color flow Doppler.  TRICUSPID VALVE TR Peak grad:   18.5 mmHg TR Vmax:        215.00 cm/s Buford Dresser MD Electronically signed by Buford Dresser MD Signature Date/Time: 04/28/2021/4:31:01 PM    Final         Discharge Exam: Vitals:   05/07/21 0820 05/07/21 0847  BP:  108/76  Pulse:  90  Resp:  20  Temp:    SpO2: 98% 100%   Vitals:   05/07/21 0202 05/07/21 0504 05/07/21 0820 05/07/21 0847  BP: 113/73 110/89  108/76  Pulse: 79 74  90  Resp: 20 20  20   Temp: 98 F (36.7 C) 98.2 F (36.8 C)    TempSrc: Oral Oral    SpO2: 99% 99% 98% 100%  Weight:  75.6 kg    Height:  6' (1.829 m)      General: Pt is alert, awake, not in acute distress Cardiovascular: RRR, S1/S2 +, no rubs, no gallops Respiratory: CTA bilaterally, no wheezing, no rhonchi Abdominal: Soft, NT, ND, bowel sounds + Extremities: no edema, no cyanosis   The results of significant diagnostics from this hospitalization (including imaging, microbiology, ancillary and laboratory) are listed below for reference.    Significant Diagnostic Studies: DG Chest 2 View  Result Date: 04/24/2021 CLINICAL DATA:  Dyspnea EXAM: CHEST - 2 VIEW COMPARISON:  04/23/2021 FINDINGS: Stable cardiomediastinal contours. Increasing hazy opacity at the right lung base. Small layering bilateral pleural effusions. No pneumothorax. IMPRESSION: Small bilateral pleural effusions with increasing hazy opacity at the right lung base, atelectasis versus pneumonia. Electronically Signed   By: Davina Poke D.O.   On: 04/24/2021 07:39   CT Angio Chest Pulmonary Embolism (PE) W or WO Contrast  Result Date: 05/04/2021 CLINICAL DATA:  Patient complains of shortness of breath and some chest discomfort. EXAM: CT ANGIOGRAPHY CHEST WITH CONTRAST TECHNIQUE: Multidetector CT imaging of the chest was performed using the standard protocol during bolus administration of intravenous contrast.  Multiplanar CT image reconstructions and MIPs were obtained to evaluate the vascular anatomy. CONTRAST:  Intravenous contrast administered. COMPARISON:  Chest x-ray 05/04/2021 FINDINGS: Cardiovascular: Satisfactory opacification of the pulmonary arteries to the segmental level. No evidence of pulmonary embolism. The main pulmonary artery is enlarged in caliber measuring to 3.3 cm. Normal heart size. No significant pericardial effusion. The thoracic aorta is normal in caliber. At least mild atherosclerotic plaque of the thoracic aorta. Four-vessel coronary artery calcifications. Mediastinum/Nodes: No enlarged mediastinal, hilar, or axillary lymph nodes. Thyroid gland, trachea, and esophagus demonstrate no significant findings. Lungs/Pleura: Interlobular septal wall thickening within bilateral upper lobes. Peribronchovascular ground-glass airspace opacities which is most prominent within the bilateral upper lobes. No focal consolidation. Few scattered pulmonary micronodules in the right lung. No pulmonary mass. Bilateral trace pleural effusions. No pneumothorax. Debris noted within the trachea and right mainstem bronchi. Upper Abdomen: No acute abnormality. Musculoskeletal: No chest wall abnormality. No suspicious lytic or blastic osseous lesions. No acute displaced fracture. Review of the MIP images confirms the above findings. IMPRESSION:  1. No pulmonary embolus. 2. Pulmonary edema with bilateral trace pleural effusions. Superimposed infection/inflammation not excluded with peribronchovascular ground-glass airspace opacities. 3. Debris noted within the trachea and right mainstem bronchi. Electronically Signed   By: Iven Finn M.D.   On: 05/04/2021 23:05   DG Chest Port 1 View  Result Date: 05/04/2021 CLINICAL DATA:  Provided history: Shortness of breath. Additional history provided: History of atrial flutter, hypertension, current smoker. EXAM: PORTABLE CHEST 1 VIEW COMPARISON:  Prior chest radiographs  04/24/2021 and earlier. FINDINGS: Heart size at the upper limits of normal, unchanged. No appreciable airspace consolidation or pulmonary edema. No evidence of pleural effusion on this AP portable radiograph. No evidence of pneumothorax. No acute bony abnormality identified. Degenerative changes of the spine. IMPRESSION: No evidence of acute cardiopulmonary abnormality. Electronically Signed   By: Kellie Simmering D.O.   On: 05/04/2021 19:43   DG Chest Port 1 View  Result Date: 04/23/2021 CLINICAL DATA:  Shortness of breath EXAM: PORTABLE CHEST 1 VIEW COMPARISON:  Chest x-ray dated August 31, 2009 FINDINGS: Cardiac and mediastinal contours are within normal limits for AP technique. Mild bilateral interstitial opacities. No focal consolidation. No large pleural effusion or pneumothorax. IMPRESSION: Mild bilateral interstitial opacities, possibly due to pulmonary edema. Electronically Signed   By: Yetta Glassman M.D.   On: 04/23/2021 12:36   DG Chest Port 1V same Day  Result Date: 04/23/2021 CLINICAL DATA:  Shortness of breath and chest tightness. EXAM: PORTABLE CHEST 1 VIEW COMPARISON:  Chest x-ray 04/23/2021. FINDINGS: Heart size is upper limits of normal, unchanged. There is no focal lung consolidation, pleural effusion or pneumothorax. No acute fractures are seen. IMPRESSION: No active disease. Electronically Signed   By: Ronney Asters M.D.   On: 04/23/2021 19:37   ECHOCARDIOGRAM COMPLETE  Result Date: 04/23/2021    ECHOCARDIOGRAM REPORT   Patient Name:   JUANYA VILLAVICENCIO Date of Exam: 04/23/2021 Medical Rec #:  [id number]      Height:       72.0 in Accession #:    [id number]     Weight:       175.0 lb Date of Birth:  01-Aug-1965     BSA:          2.013 m Patient Age:    73 years       BP:           109/98 mmHg Patient Gender: M              HR:           112 bpm. Exam Location:  [facility] Procedure: 2D Echo, Cardiac Doppler and Color Doppler STAT ECHO Indications:    Abnormal ECG R94.31  History:         Patient has no prior history of Echocardiogram examinations.                 Arrythmias:Atrial Flutter; Risk Factors:Hypertension.  Sonographer:    Alvino Chapel RCS Referring Phys: [phone] COURAGE EMOKPAE IMPRESSIONS  1. Left ventricular ejection fraction, by estimation, is approximately 20% in the setting of atrial flutter. The left ventricle has severely decreased function. The left ventricle demonstrates global hypokinesis. The left ventricular internal cavity size was mildly dilated. There is mild left ventricular hypertrophy. Left ventricular diastolic parameters are indeterminate.  2. Right ventricular systolic function is mildly reduced. The right ventricular size is normal. There is moderately elevated pulmonary artery systolic pressure. The estimated right ventricular systolic pressure is 33.2 mmHg.  3.  Left atrial size was moderately dilated.  4. Right atrial size was mildly dilated.  5. The mitral valve is grossly normal. Mild to moderate mitral valve regurgitation.  6. Tricuspid valve regurgitation is moderate.  7. The aortic valve is tricuspid. Aortic valve regurgitation is not visualized.  8. The inferior vena cava is dilated in size with <50% respiratory variability, suggesting right atrial pressure of 15 mmHg. Comparison(s): No prior Echocardiogram. FINDINGS  Left Ventricle: Left ventricular ejection fraction, by estimation, is 20%. The left ventricle has severely decreased function. The left ventricle demonstrates global hypokinesis. The left ventricular internal cavity size was mildly dilated. There is mild left ventricular hypertrophy. Left ventricular diastolic parameters are indeterminate. Right Ventricle: The right ventricular size is normal. No increase in right ventricular wall thickness. Right ventricular systolic function is mildly reduced. There is moderately elevated pulmonary artery systolic pressure. The tricuspid regurgitant velocity is 3.00 m/s, and with an assumed right atrial  pressure of 15 mmHg, the estimated right ventricular systolic pressure is 19.5 mmHg. Left Atrium: Left atrial size was moderately dilated. Right Atrium: Right atrial size was mildly dilated. Pericardium: There is no evidence of pericardial effusion. Presence of pericardial fat pad. Mitral Valve: The mitral valve is grossly normal. There is mild thickening of the mitral valve leaflet(s). Mild to moderate mitral valve regurgitation. Tricuspid Valve: The tricuspid valve is grossly normal. Tricuspid valve regurgitation is moderate. Aortic Valve: The aortic valve is tricuspid. There is mild aortic valve annular calcification. Aortic valve regurgitation is not visualized. Pulmonic Valve: The pulmonic valve was grossly normal. Pulmonic valve regurgitation is trivial. Aorta: The aortic root is normal in size and structure. Venous: The inferior vena cava is dilated in size with less than 50% respiratory variability, suggesting right atrial pressure of 15 mmHg. IAS/Shunts: No atrial level shunt detected by color flow Doppler.  LEFT VENTRICLE PLAX 2D LVIDd:         6.00 cm LVIDs:         5.20 cm LV PW:         1.10 cm LV IVS:        1.00 cm LVOT diam:     2.00 cm LV SV:         22 LV SV Index:   11 LVOT Area:     3.14 cm  RIGHT VENTRICLE TAPSE (M-mode): 1.4 cm LEFT ATRIUM              Index        RIGHT ATRIUM           Index LA diam:        5.00 cm  2.48 cm/m   RA Area:     21.10 cm LA Vol (A2C):   102.0 ml 50.67 ml/m  RA Volume:   64.10 ml  31.84 ml/m LA Vol (A4C):   93.0 ml  46.20 ml/m LA Biplane Vol: 101.0 ml 50.17 ml/m  AORTIC VALVE LVOT Vmax:   47.17 cm/s LVOT Vmean:  36.733 cm/s LVOT VTI:    0.070 m  AORTA Ao Root diam: 3.60 cm MITRAL VALVE               TRICUSPID VALVE MV Area (PHT): 5.02 cm    TR Peak grad:   36.0 mmHg MV Decel Time: 151 msec    TR Vmax:        300.00 cm/s MV E velocity: 97.70 cm/s  SHUNTS                            Systemic VTI:  0.07 m                             Systemic Diam: 2.00 cm Rozann Lesches MD Electronically signed by Rozann Lesches MD Signature Date/Time: 04/23/2021/3:59:06 PM    Final    ECHO TEE  Result Date: 04/28/2021    TRANSESOPHOGEAL ECHO REPORT   Patient Name:   Bryan Pace Date of Exam: 04/28/2021 Medical Rec #:  [id number]      Height:       72.0 in Accession #:    [id number]     Weight:       169.1 lb Date of Birth:  [birth year]     BSA:          1.984 m Patient Age:    40 years       BP:           108/89 mmHg Patient Gender: M              HR:           145 bpm. Exam Location:  Inpatient Procedure: Transesophageal Echo, Color Doppler and Cardiac Doppler Indications:     I48.92* Unspecified atrial flutter  History:         Patient has prior history of Echocardiogram examinations, most                  recent 04/23/2021. CHF, Arrythmias:Atrial Flutter; Risk                  Factors:Hypertension, Current Smoker and ETOH.  Sonographer:     Bernadene Person RDCS Referring Phys:  [id number] Erma Heritage Diagnosing Phys: Buford Dresser MD PROCEDURE: After discussion of the risks and benefits of a TEE, an informed consent was obtained from the patient. The transesophogeal probe was passed without difficulty through the esophogus of the patient. Local oropharyngeal anesthetic was provided with Cetacaine. Sedation performed by different physician. The patient was monitored while under deep sedation. Anesthestetic sedation was provided intravenously by Anesthesiology: 219mg  of Propofol. The patient's vital signs; including heart rate, blood  pressure, and oxygen saturation; remained stable throughout the procedure. The patient developed no complications during the procedure. A successful direct current cardioversion was performed at 120 joules with 1 attempt. IMPRESSIONS  1. Left ventricular ejection fraction, by estimation, is <20%. The left ventricle has severely decreased function. The left ventricle demonstrates global hypokinesis. The left  ventricular internal cavity size was mildly dilated.  2. Right ventricular systolic function is normal. The right ventricular size is normal.  3. No left atrial/left atrial appendage thrombus was detected.  4. The mitral valve is normal in structure. Mild mitral valve regurgitation. No evidence of mitral stenosis.  5. The aortic valve is tricuspid. Aortic valve regurgitation is trivial. No aortic stenosis is present.  6. There is mild (Grade II) plaque involving the descending aorta. Conclusion(s)/Recommendation(s): No LA/LAA thrombus identified. Successful cardioversion performed with restoration of normal sinus rhythm. FINDINGS  Left Ventricle: Left ventricular ejection fraction, by estimation, is <20%. The left ventricle has severely decreased function. The left ventricle demonstrates global hypokinesis. The left ventricular internal cavity size was mildly dilated. Right Ventricle: The right ventricular size is normal. No increase in right ventricular wall thickness. Right ventricular systolic  function is normal. Left Atrium: Left atrial size was normal in size. No left atrial/left atrial appendage thrombus was detected. Right Atrium: Right atrial size was normal in size. Pericardium: There is no evidence of pericardial effusion. Mitral Valve: The mitral valve is normal in structure. Mild mitral valve regurgitation. No evidence of mitral valve stenosis. There is no evidence of mitral valve vegetation. Tricuspid Valve: The tricuspid valve is normal in structure. Tricuspid valve regurgitation is mild . No evidence of tricuspid stenosis. There is no evidence of tricuspid valve vegetation. Aortic Valve: The aortic valve is tricuspid. Aortic valve regurgitation is trivial. No aortic stenosis is present. There is no evidence of aortic valve vegetation. Pulmonic Valve: The pulmonic valve was grossly normal. Pulmonic valve regurgitation is trivial. No evidence of pulmonic stenosis. There is no evidence of pulmonic valve  vegetation. Aorta: The aortic root and ascending aorta are structurally normal, with no evidence of dilitation. There is mild (Grade II) plaque involving the descending aorta. IAS/Shunts: No atrial level shunt detected by color flow Doppler.  TRICUSPID VALVE TR Peak grad:   18.5 mmHg TR Vmax:        215.00 cm/s Buford Dresser MD Electronically signed by Buford Dresser MD Signature Date/Time: 04/28/2021/4:31:01 PM    Final     Microbiology: Recent Results (from the past 240 hour(s))  Resp Panel by RT-PCR (Flu A&B, Covid) Nasopharyngeal Swab     Status: None   Collection Time: 05/04/21  9:30 PM   Specimen: Nasopharyngeal Swab; Nasopharyngeal(NP) swabs in vial transport medium  Result Value Ref Range Status   SARS Coronavirus 2 by RT PCR NEGATIVE NEGATIVE Final    Comment: (NOTE) SARS-CoV-2 target nucleic acids are NOT DETECTED.  The SARS-CoV-2 RNA is generally detectable in upper respiratory specimens during the acute phase of infection. The lowest concentration of SARS-CoV-2 viral copies this assay can detect is 138 copies/mL. A negative result does not preclude SARS-Cov-2 infection and should not be used as the sole basis for treatment or other patient management decisions. A negative result may occur with  improper specimen collection/handling, submission of specimen other than nasopharyngeal swab, presence of viral mutation(s) within the areas targeted by this assay, and inadequate number of viral copies(<138 copies/mL). A negative result must be combined with clinical observations, patient history, and epidemiological information. The expected result is Negative.  Fact Sheet for Patients:  EntrepreneurPulse.com.au  Fact Sheet for Healthcare Providers:  IncredibleEmployment.be  This test is no t yet approved or cleared by the [place] FDA and  has been authorized for detection and/or diagnosis of SARS-CoV-2 by FDA under an  Emergency Use Authorization (EUA). This EUA will remain  in effect (meaning this test can be used) for the duration of the COVID-19 declaration under Section 564(b)(1) of the Act, 21 U.S.C.section 360bbb-3(b)(1), unless the authorization is terminated  or revoked sooner.       Influenza A by PCR NEGATIVE NEGATIVE Final   Influenza B by PCR NEGATIVE NEGATIVE Final    Comment: (NOTE) The Xpert Xpress SARS-CoV-2/FLU/RSV plus assay is intended as an aid in the diagnosis of influenza from Nasopharyngeal swab specimens and should not be used as a sole basis for treatment. Nasal washings and aspirates are unacceptable for Xpert Xpress SARS-CoV-2/FLU/RSV testing.  Fact Sheet for Patients: EntrepreneurPulse.com.au  Fact Sheet for Healthcare Providers: IncredibleEmployment.be  This test is not yet approved or cleared by the [place] FDA and has been authorized for detection and/or diagnosis of SARS-CoV-2 by FDA under an  Emergency Use Authorization (EUA). This EUA will remain in effect (meaning this test can be used) for the duration of the COVID-19 declaration under Section 564(b)(1) of the Act, 21 U.S.C. section 360bbb-3(b)(1), unless the authorization is terminated or revoked.  Performed at [facility], [address], [place], [place] [address]      Labs: Basic Metabolic Panel: Recent Labs  Lab 05/04/21 1918 05/05/21 0142  NA 122* 125*  K 4.0 4.1  CL 90* 89*  CO2 22 24  GLUCOSE 108* 143*  BUN 8 10  CREATININE 0.81 0.83  CALCIUM 8.4* 8.8*  MG  --  1.8   Liver Function Tests: Recent Labs  Lab 05/04/21 1918 05/05/21 0142  AST 25 24  ALT 27 27  ALKPHOS 70 75  BILITOT 0.1* 0.5  PROT 7.0 7.4  ALBUMIN 3.6 3.7   No results for input(s): LIPASE, AMYLASE in the last 168 hours. No results for input(s): AMMONIA in the last 168 hours. CBC: Recent Labs  Lab 05/04/21 1918 05/05/21 0142  WBC 10.8* 9.9  NEUTROABS  --  9.3*  HGB  11.1* 12.1*  HCT 33.2* 36.2*  MCV 88.3 85.0  PLT 441* 425*   Cardiac Enzymes: No results for input(s): CKTOTAL, CKMB, CKMBINDEX, TROPONINI in the last 168 hours. BNP: Invalid input(s): POCBNP CBG: No results for input(s): GLUCAP in the last 168 hours.  Time coordinating discharge:  36 minutes  Signed:  Orson Eva, DO [facility] Pager: [phone] 05/07/2021, 10:17 AM

## 2021-05-07 NOTE — Progress Notes (Signed)
Progress Note  Patient Name: Bryan Pace Date of Encounter: 05/07/2021  Primary Cardiologist: Carlyle Dolly, MD  Subjective   Continues to feel better, would like to go home.  Sinus rhythm with PACs  Inpatient Medications    Scheduled Meds:  albuterol  2.5 mg Nebulization BID   Chlorhexidine Gluconate Cloth  6 each Topical Daily   digoxin  0.125 mg Oral Daily   diphenhydrAMINE  50 mg Intravenous Once   furosemide  40 mg Oral Daily   mouth rinse  15 mL Mouth Rinse BID   metoprolol succinate  75 mg Oral BID   pantoprazole  40 mg Oral Daily   rivaroxaban  20 mg Oral Q supper   spironolactone  12.5 mg Oral Daily    PRN Meds: acetaminophen **OR** acetaminophen, albuterol, ALPRAZolam, morphine injection, ondansetron **OR** ondansetron (ZOFRAN) IV, oxyCODONE   Vital Signs    Vitals:   05/07/21 0202 05/07/21 0504 05/07/21 0820 05/07/21 0847  BP: 113/73 110/89  108/76  Pulse: 79 74  90  Resp: 20 20  20   Temp: 98 F (36.7 C) 98.2 F (36.8 C)    TempSrc: Oral Oral    SpO2: 99% 99% 98% 100%  Weight:  75.6 kg    Height:  6' (1.829 m)      Intake/Output Summary (Last 24 hours) at 05/07/2021 0927 Last data filed at 05/07/2021 0836 Gross per 24 hour  Intake 356 ml  Output 1700 ml  Net -1344 ml   Filed Weights   05/05/21 1139 05/06/21 0500 05/07/21 0504  Weight: 77.2 kg 75.6 kg 75.6 kg    Telemetry    Sinus rhythm with PACs.  Personally reviewed.  ECG    An ECG dated 05/05/2021 was personally reviewed today and demonstrated:  Sinus rhythm with PACs.  Physical Exam   GEN: No acute distress.   Neck: No JVD. Cardiac: RRR, no gallop.  Respiratory: Nonlabored.  Clear to auscultation bilaterally. GI: Soft, nontender, bowel sounds present. MS: No edema; No deformity. Neuro:  Nonfocal. Psych: Alert and oriented x 3. Normal affect.  Labs    Chemistry Recent Labs  Lab 05/04/21 1918 05/05/21 0142  NA 122* 125*  K 4.0 4.1  CL 90* 89*  CO2 22 24   GLUCOSE 108* 143*  BUN 8 10  CREATININE 0.81 0.83  CALCIUM 8.4* 8.8*  PROT 7.0 7.4  ALBUMIN 3.6 3.7  AST 25 24  ALT 27 27  ALKPHOS 70 75  BILITOT 0.1* 0.5  GFRNONAA >60 >60  ANIONGAP 10 12     Hematology Recent Labs  Lab 05/04/21 1918 05/05/21 0142  WBC 10.8* 9.9  RBC 3.76* 4.26  HGB 11.1* 12.1*  HCT 33.2* 36.2*  MCV 88.3 85.0  MCH 29.5 28.4  MCHC 33.4 33.4  RDW 14.7 14.6  PLT 441* 425*    Cardiac Enzymes Recent Labs  Lab 04/23/21 1154 04/23/21 1331 05/04/21 1918 05/04/21 2130  TROPONINIHS 40* 36* 14 18*    BNP Recent Labs  Lab 05/04/21 1918  BNP 1,035.0*     DDimer Recent Labs  Lab 05/04/21 1918  DDIMER 1.02*     Radiology    No results found.  Cardiac Studies   TEE 04/28/2021:  1. Left ventricular ejection fraction, by estimation, is <20%. The left  ventricle has severely decreased function. The left ventricle demonstrates  global hypokinesis. The left ventricular internal cavity size was mildly  dilated.   2. Right ventricular systolic function is normal. The right  ventricular  size is normal.   3. No left atrial/left atrial appendage thrombus was detected.   4. The mitral valve is normal in structure. Mild mitral valve  regurgitation. No evidence of mitral stenosis.   5. The aortic valve is tricuspid. Aortic valve regurgitation is trivial.  No aortic stenosis is present.   6. There is mild (Grade II) plaque involving the descending aorta.  Assessment & Plan    1.  HFrEF with acute systolic heart failure.  Symptomatically improved with diuresis and medication adjustments.  2.  Typical atrial flutter status post recent successful TEE guided cardioversion on October 24.  He is on Xarelto for stroke prophylaxis and remains in sinus rhythm.  3.  Hyponatremia, sodium relatively stable.  4.  Regular alcohol intake, not clearly to excess per discussion.  No signs of withdrawal.  Stable for discharge from a cardiac perspective.   Increase Toprol-XL to 100 mg twice daily for discharge, continue Xarelto, Aldactone, digoxin and Lasix.  Given blood pressure range, would hold off on ARB, ANRI, and SGLT2 inhibitor.  He already has scheduled follow-up with Dr. Harl Bowie and a consultation with Dr. Lovena Le to discuss possibility of atrial flutter ablation as part of his management strategy.  We will sign off.  Signed, Rozann Lesches, MD  05/07/2021, 9:27 AM

## 2021-05-09 DIAGNOSIS — Z0279 Encounter for issue of other medical certificate: Secondary | ICD-10-CM

## 2021-05-09 NOTE — Telephone Encounter (Signed)
Forms completed by Dr. Harl Bowie. Forms faxed to patient's employment. Picked up by patient.

## 2021-05-12 NOTE — Telephone Encounter (Signed)
Patient is following up. He states there are a few areas on FMLA paperwork that are incomplete. Please return call to discuss when able.

## 2021-05-13 ENCOUNTER — Encounter: Payer: Self-pay | Admitting: Internal Medicine

## 2021-05-13 ENCOUNTER — Telehealth: Payer: Self-pay | Admitting: Cardiology

## 2021-05-13 ENCOUNTER — Ambulatory Visit: Payer: BC Managed Care – PPO | Admitting: Internal Medicine

## 2021-05-13 ENCOUNTER — Encounter: Payer: Self-pay | Admitting: *Deleted

## 2021-05-13 ENCOUNTER — Other Ambulatory Visit: Payer: Self-pay

## 2021-05-13 VITALS — BP 120/70 | HR 68 | Ht 72.0 in | Wt 167.7 lb

## 2021-05-13 DIAGNOSIS — I4892 Unspecified atrial flutter: Secondary | ICD-10-CM

## 2021-05-13 NOTE — Telephone Encounter (Signed)
Pt is following up on FMLA paperwork, he has an upcoming appt this morning and he would like to pick up the paperwork

## 2021-05-13 NOTE — Patient Instructions (Addendum)
Medication Instructions:  Your physician recommends that you continue on your current medications as directed. Please refer to the Current Medication list given to you today.  *If you need a refill on your cardiac medications before your next appointment, please call your pharmacy*   Lab Work: Your physician recommends that you return for lab work in: Next Week ( BMET, CBC)   If you have labs (blood work) drawn today and your tests are completely normal, you will receive your results only by: MyChart Message (if you have MyChart) OR A paper copy in the mail If you have any lab test that is abnormal or we need to change your treatment, we will call you to review the results.   Testing/Procedures: Your physician has recommended that you have an ablation. Catheter ablation is a medical procedure used to treat some cardiac arrhythmias (irregular heartbeats). During catheter ablation, a long, thin, flexible tube is put into a blood vessel in your groin (upper thigh), or neck. This tube is called an ablation catheter. It is then guided to your heart through the blood vessel. Radio frequency waves destroy small areas of heart tissue where abnormal heartbeats may cause an arrhythmia to start. Please see the instruction sheet given to you today.    Follow-Up: At [facility], you and your health needs are our priority.  As part of our continuing mission to provide you with exceptional heart care, we have created designated Provider Care Teams.  These Care Teams include your primary Cardiologist (physician) and Advanced Practice Providers (APPs -  Physician Assistants and Nurse Practitioners) who all work together to provide you with the care you need, when you need it.  We recommend signing up for the patient portal called "MyChart".  Sign up information is provided on this After Visit Summary.  MyChart is used to connect with patients for Virtual Visits (Telemedicine).  Patients are able to view  lab/test results, encounter notes, upcoming appointments, etc.  Non-urgent messages can be sent to your provider as well.   To learn more about what you can do with MyChart, go to [email or web address].    Your next appointment:    After Ablation   The format for your next appointment:   In Person  Provider:   Cristopher Peru, MD    Other Instructions Thank you for choosing [facility]!  Cardiac Ablation Cardiac ablation is a procedure to destroy, or ablate, a small amount of heart tissue in very specific places. The heart has many electrical connections. Sometimes these connections are abnormal and can cause the heart to beat very fast or irregularly. Ablating some of the areas that cause problems can improve the heart's rhythm or return it to normal. Ablation may be done for people who: Have Wolff-Parkinson-White syndrome. Have fast heart rhythms (tachycardia). Have taken medicines for an abnormal heart rhythm (arrhythmia) that were not effective or caused side effects. Have a high-risk heartbeat that may be life-threatening. During the procedure, a small incision is made in the neck or the groin, and a long, thin tube (catheter) is inserted into the incision and moved to the heart. Small devices (electrodes) on the tip of the catheter will send out electrical currents. A type of X-ray (fluoroscopy) will be used to help guide the catheter and to provide images of the heart. Tell a health care provider about: Any allergies you have. All medicines you are taking, including vitamins, herbs, eye drops, creams, and over-the-counter medicines. Any problems you or family members  have had with anesthetic medicines. Any blood disorders you have. Any surgeries you have had. Any medical conditions you have, such as kidney failure. Whether you are pregnant or may be pregnant. What are the risks? Generally, this is a safe procedure. However, problems may occur,  including: Infection. Bruising and bleeding at the catheter insertion site. Bleeding into the chest, especially into the sac that surrounds the heart. This is a serious complication. Stroke or blood clots. Damage to nearby structures or organs. Allergic reaction to medicines or dyes. Need for a permanent pacemaker if the normal electrical system is damaged. A pacemaker is a small computer that sends electrical signals to the heart and helps your heart beat normally. The procedure not being fully effective. This may not be recognized until months later. Repeat ablation procedures are sometimes done. What happens before the procedure? Medicines Ask your health care provider about: Changing or stopping your regular medicines. This is especially important if you are taking diabetes medicines or blood thinners. Taking medicines such as aspirin and ibuprofen. These medicines can thin your blood. Do not take these medicines unless your health care provider tells you to take them. Taking over-the-counter medicines, vitamins, herbs, and supplements. General instructions Follow instructions from your health care provider about eating or drinking restrictions. Plan to have someone take you home from the hospital or clinic. If you will be going home right after the procedure, plan to have someone with you for 24 hours. Ask your health care provider what steps will be taken to prevent infection. What happens during the procedure?  An IV will be inserted into one of your veins. You will be given a medicine to help you relax (sedative). The skin on your neck or groin will be numbed. An incision will be made in your neck or your groin. A needle will be inserted through the incision and into a large vein in your neck or groin. A catheter will be inserted into the needle and moved to your heart. Dye may be injected through the catheter to help your surgeon see the area of the heart that needs  treatment. Electrical currents will be sent from the catheter to ablate heart tissue in desired areas. There are three types of energy that may be used to do this: Heat (radiofrequency energy). Laser energy. Extreme cold (cryoablation). When the tissue has been ablated, the catheter will be removed. Pressure will be held on the insertion area to prevent a lot of bleeding. A bandage (dressing) will be placed over the insertion area. The exact procedure may vary among health care providers and hospitals. What happens after the procedure? Your blood pressure, heart rate, breathing rate, and blood oxygen level will be monitored until you leave the hospital or clinic. Your insertion area will be monitored for bleeding. You will need to lie still for a few hours to ensure that you do not bleed from the insertion area. Do not drive for 24 hours or as long as told by your health care provider. Summary Cardiac ablation is a procedure to destroy, or ablate, a small amount of heart tissue using an electrical current. This procedure can improve the heart rhythm or return it to normal. Tell your health care provider about any medical conditions you may have and all medicines you are taking to treat them. This is a safe procedure, but problems may occur. Problems may include infection, bruising, damage to nearby organs or structures, or allergic reactions to medicines. Follow your health care  provider's instructions about eating and drinking before the procedure. You may also be told to change or stop some of your medicines. After the procedure, do not drive for 24 hours or as long as told by your health care provider. This information is not intended to replace advice given to you by your health care provider. Make sure you discuss any questions you have with your health care provider. Document Revised: 05/01/2019 Document Reviewed: 05/01/2019 Elsevier Patient Education  [place].

## 2021-05-13 NOTE — Telephone Encounter (Signed)
Received FMLA from patient thru [employer]. 05-09-2021 Papers signed by patient. Placed on Dr.Branch's desk [facility] ) Office. 05-09-2021
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
## 2021-05-21 NOTE — Telephone Encounter (Signed)
Paper work for [employer] is completed and has been scanned into pt chart under Media.

## 2021-05-23 ENCOUNTER — Ambulatory Visit (INDEPENDENT_AMBULATORY_CARE_PROVIDER_SITE_OTHER): Payer: BC Managed Care – PPO | Admitting: Cardiology

## 2021-05-23 ENCOUNTER — Encounter: Payer: Self-pay | Admitting: Cardiology

## 2021-05-23 VITALS — BP 110/80 | HR 68 | Ht 72.0 in | Wt 165.4 lb

## 2021-05-23 DIAGNOSIS — I5022 Chronic systolic (congestive) heart failure: Secondary | ICD-10-CM | POA: Diagnosis not present

## 2021-05-23 DIAGNOSIS — I4892 Unspecified atrial flutter: Secondary | ICD-10-CM | POA: Diagnosis not present

## 2021-05-23 MED ORDER — LOSARTAN POTASSIUM 25 MG PO TABS: 12.5000 mg | ORAL_TABLET | Freq: Every day | ORAL | 3 refills | Status: DC

## 2021-05-23 NOTE — Patient Instructions (Signed)
Medication Instructions:  Your physician has recommended you make the following change in your medication:  START Losartan 12.5 mg tablets daily  *If you need a refill on your cardiac medications before your next appointment, please call your pharmacy*   Lab Work: None If you have labs (blood work) drawn today and your tests are completely normal, you will receive your results only by: [place] (if you have MyChart) OR A paper copy in the mail If you have any lab test that is abnormal or we need to change your treatment, we will call you to review the results.   Testing/Procedures: None   Follow-Up: At [facility], you and your health needs are our priority.  As part of our continuing mission to provide you with exceptional heart care, we have created designated Provider Care Teams.  These Care Teams include your primary Cardiologist (physician) and Advanced Practice Providers (APPs -  Physician Assistants and Nurse Practitioners) who all work together to provide you with the care you need, when you need it.  We recommend signing up for the patient portal called "MyChart".  Sign up information is provided on this After Visit Summary.  MyChart is used to connect with patients for Virtual Visits (Telemedicine).  Patients are able to view lab/test results, encounter notes, upcoming appointments, etc.  Non-urgent messages can be sent to your provider as well.   To learn more about what you can do with MyChart, go to [email or web address].    Your next appointment:   3-4 week(s)  The format for your next appointment:   In Person  Provider:   Carlyle Dolly, MD    Other Instructions
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
## 2021-05-27 ENCOUNTER — Other Ambulatory Visit (HOSPITAL_COMMUNITY)
Admission: RE | Admit: 2021-05-27 | Discharge: 2021-05-27 | Disposition: A | Discharge status: Home / Self Care | Payer: BC Managed Care – PPO | Source: Ambulatory Visit | Attending: Internal Medicine | Admitting: Internal Medicine

## 2021-05-27 ENCOUNTER — Other Ambulatory Visit: Payer: Self-pay

## 2021-05-27 DIAGNOSIS — I4892 Unspecified atrial flutter: Secondary | ICD-10-CM | POA: Diagnosis not present

## 2021-05-27 LAB — BASIC METABOLIC PANEL
Anion gap: 9 (ref 5–15)
BUN: 11 mg/dL (ref 6–20)
CO2: 30 mmol/L (ref 22–32)
Calcium: 9.4 mg/dL (ref 8.9–10.3)
Chloride: 97 mmol/L — ABNORMAL LOW (ref 98–111)
Creatinine, Ser: 1.07 mg/dL (ref 0.61–1.24)
GFR, Estimated: >60 mL/min (ref >60)
Glucose, Bld: 96 mg/dL (ref 70–99)
Potassium: 3.3 mmol/L — ABNORMAL LOW (ref 3.5–5.1)
Sodium: 136 mmol/L (ref 135–145)

## 2021-05-27 LAB — CBC
HCT: 42.1 % (ref 39.0–52.0)
Hemoglobin: 14.3 g/dL (ref 13.0–17.0)
MCH: 29.2 pg (ref 26.0–34.0)
MCHC: 34 g/dL (ref 30.0–36.0)
MCV: 85.9 fL (ref 80.0–100.0)
Platelets: 289 10*3/uL (ref 150–400)
RBC: 4.9 MIL/uL (ref 4.22–5.81)
RDW: 15.1 % (ref 11.5–15.5)
WBC: 6.2 10*3/uL (ref 4.0–10.5)
nRBC: 0 % (ref 0.0–0.2)

## 2021-05-31 ENCOUNTER — Other Ambulatory Visit: Payer: Self-pay | Admitting: Physician Assistant

## 2021-06-05 ENCOUNTER — Ambulatory Visit: Payer: BC Managed Care – PPO | Admitting: Cardiology

## 2021-06-06 ENCOUNTER — Telehealth: Payer: Self-pay | Admitting: Cardiology

## 2021-06-06 NOTE — Telephone Encounter (Signed)
Pt would like to advise our office that his employer will be faxing [place] soon

## 2021-06-10 ENCOUNTER — Telehealth: Payer: Self-pay | Admitting: Cardiology

## 2021-06-10 MED ORDER — METOPROLOL SUCCINATE ER 100 MG PO TB24: 100.0000 mg | ORAL_TABLET | Freq: Two times a day (BID) | ORAL | 1 refills | Status: DC

## 2021-06-10 NOTE — Telephone Encounter (Signed)
*  STAT* If patient is at the pharmacy, call can be transferred to refill team.   1. Which medications need to be refilled? (please list name of each medication and dose if known) Metoprolol  2. Which pharmacy/location (including street and city if local pharmacy) is medication to be sent to? [facility] [address],  [place]    they need a 30 day or 90 day supply? 90 days and refills

## 2021-06-11 NOTE — Telephone Encounter (Signed)
Pt is calling to f.u

## 2021-06-11 NOTE — Telephone Encounter (Signed)
Spoke to pt who was notified that the last short term disability papers that [place] received were from 05/09/2021. Pt is looking for newer disability papers. Pt has appt with Dr. Harl Bowie on 06/12/21. Pt will call his employer to send papers again by fax.

## 2021-06-11 NOTE — Telephone Encounter (Signed)
Patient is following up. He states his Short Term Disability paperwork has been faxed to the office. He would like to confirm whether or not it has been received. Please advise.

## 2021-06-12 ENCOUNTER — Encounter: Payer: Self-pay | Admitting: Cardiology

## 2021-06-12 ENCOUNTER — Ambulatory Visit (INDEPENDENT_AMBULATORY_CARE_PROVIDER_SITE_OTHER): Payer: BC Managed Care – PPO | Admitting: Cardiology

## 2021-06-12 ENCOUNTER — Telehealth: Payer: Self-pay | Admitting: Cardiology

## 2021-06-12 VITALS — BP 122/84 | HR 64 | Ht 72.0 in | Wt 166.2 lb

## 2021-06-12 DIAGNOSIS — I5022 Chronic systolic (congestive) heart failure: Secondary | ICD-10-CM

## 2021-06-12 DIAGNOSIS — I4892 Unspecified atrial flutter: Secondary | ICD-10-CM | POA: Diagnosis not present

## 2021-06-12 MED ORDER — METOPROLOL SUCCINATE ER 100 MG PO TB24: 100.0000 mg | ORAL_TABLET | Freq: Two times a day (BID) | ORAL | 1 refills | Status: DC

## 2021-06-12 MED ORDER — ENTRESTO 24-26 MG PO TABS: 1.0000 | ORAL_TABLET | Freq: Two times a day (BID) | ORAL | 11 refills | Status: DC

## 2021-06-12 NOTE — Pre-Procedure Instructions (Signed)
Instructed patient on the following items: Arrival time 0530 Nothing to eat or drink after midnight No meds AM of procedure Responsible person to drive you home and stay with you for 24 hrs  Have you missed any doses of anti-coagulant Xarelto- hold today's dose
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
## 2021-06-12 NOTE — Patient Instructions (Signed)
Medication Instructions:  STOP Losartan    START Entresto 24/26 mg twice a day    Labwork: None   Testing/Procedures: None    Follow-Up: 1 month  Any Other Special Instructions Will Be Listed Below (If Applicable).  If you need a refill on your cardiac medications before your next appointment, please call your pharmacy.

## 2021-06-12 NOTE — Telephone Encounter (Signed)
Patient states he has an ablation tomorrow and wants to know if he takes his medications this morning.

## 2021-06-12 NOTE — Telephone Encounter (Signed)
Informed pt that he should take all medications as directed.

## 2021-06-13 ENCOUNTER — Other Ambulatory Visit: Payer: Self-pay

## 2021-06-13 ENCOUNTER — Ambulatory Visit (HOSPITAL_COMMUNITY): Payer: BC Managed Care – PPO | Admitting: Certified Registered Nurse Anesthetist

## 2021-06-13 ENCOUNTER — Encounter (HOSPITAL_COMMUNITY): Payer: Self-pay | Admitting: Internal Medicine

## 2021-06-13 ENCOUNTER — Ambulatory Visit (HOSPITAL_COMMUNITY)
Admission: RE | Admit: 2021-06-13 | Discharge: 2021-06-13 | Disposition: A | Discharge status: Home / Self Care | Payer: BC Managed Care – PPO | Attending: Internal Medicine | Admitting: Internal Medicine

## 2021-06-13 ENCOUNTER — Ambulatory Visit (HOSPITAL_COMMUNITY)
Admission: RE | Disposition: A | Discharge status: Home / Self Care | Payer: Self-pay | Source: Home / Self Care | Attending: Internal Medicine

## 2021-06-13 ENCOUNTER — Other Ambulatory Visit: Payer: Self-pay | Admitting: Physician Assistant

## 2021-06-13 DIAGNOSIS — I5022 Chronic systolic (congestive) heart failure: Secondary | ICD-10-CM | POA: Insufficient documentation

## 2021-06-13 DIAGNOSIS — I483 Typical atrial flutter: Secondary | ICD-10-CM | POA: Insufficient documentation

## 2021-06-13 DIAGNOSIS — I110 Hypertensive heart disease with heart failure: Secondary | ICD-10-CM | POA: Diagnosis not present

## 2021-06-13 DIAGNOSIS — E876 Hypokalemia: Secondary | ICD-10-CM

## 2021-06-13 DIAGNOSIS — I5021 Acute systolic (congestive) heart failure: Secondary | ICD-10-CM | POA: Diagnosis not present

## 2021-06-13 DIAGNOSIS — E871 Hypo-osmolality and hyponatremia: Secondary | ICD-10-CM | POA: Diagnosis not present

## 2021-06-13 DIAGNOSIS — Z79899 Other long term (current) drug therapy: Secondary | ICD-10-CM

## 2021-06-13 HISTORY — PX: A-FLUTTER ABLATION: EP1230

## 2021-06-13 LAB — POTASSIUM: Potassium: 2.8 mmol/L — ABNORMAL LOW (ref 3.5–5.1)

## 2021-06-13 SURGERY — A-FLUTTER ABLATION
Anesthesia: General

## 2021-06-13 MED ORDER — PROPOFOL 10 MG/ML IV BOLUS
INTRAVENOUS | Status: DC | PRN
  Administered 2021-06-13 (×2): 30 mg via INTRAVENOUS

## 2021-06-13 MED ORDER — POTASSIUM CHLORIDE 10 MEQ/100ML IV SOLN
INTRAVENOUS | Status: AC
  Filled 2021-06-13: qty 100

## 2021-06-13 MED ORDER — MIDAZOLAM HCL 2 MG/2ML IJ SOLN
INTRAMUSCULAR | Status: AC
  Filled 2021-06-13: qty 2

## 2021-06-13 MED ORDER — ONDANSETRON HCL 4 MG/2ML IJ SOLN: 4.0000 mg | Freq: Four times a day (QID) | INTRAMUSCULAR | Status: DC | PRN

## 2021-06-13 MED ORDER — SPIRONOLACTONE 25 MG PO TABS: 25.0000 mg | ORAL_TABLET | Freq: Every day | ORAL | 6 refills | Status: DC

## 2021-06-13 MED ORDER — HEPARIN (PORCINE) IN NACL 2000-0.9 UNIT/L-% IV SOLN
INTRAVENOUS | Status: DC | PRN
  Administered 2021-06-13 (×2): 1000 mL

## 2021-06-13 MED ORDER — ROCURONIUM BROMIDE 10 MG/ML (PF) SYRINGE
PREFILLED_SYRINGE | INTRAVENOUS | Status: DC | PRN
  Administered 2021-06-13: 60 mg via INTRAVENOUS

## 2021-06-13 MED ORDER — MIDAZOLAM HCL 2 MG/2ML IJ SOLN
INTRAMUSCULAR | Status: DC | PRN
  Administered 2021-06-13: 2 mg via INTRAVENOUS

## 2021-06-13 MED ORDER — SODIUM CHLORIDE 0.9% FLUSH: 3.0000 mL | Freq: Two times a day (BID) | INTRAVENOUS | Status: DC

## 2021-06-13 MED ORDER — ACETAMINOPHEN 325 MG PO TABS: 650.0000 mg | ORAL_TABLET | ORAL | Status: DC | PRN

## 2021-06-13 MED ORDER — HEPARIN SODIUM (PORCINE) 1000 UNIT/ML IJ SOLN
INTRAMUSCULAR | Status: AC
  Filled 2021-06-13: qty 10

## 2021-06-13 MED ORDER — LIDOCAINE 2% (20 MG/ML) 5 ML SYRINGE
INTRAMUSCULAR | Status: DC | PRN
  Administered 2021-06-13: 60 mg via INTRAVENOUS

## 2021-06-13 MED ORDER — SODIUM CHLORIDE 0.9 % IV SOLN: 250.0000 mL | INTRAVENOUS | Status: DC | PRN

## 2021-06-13 MED ORDER — PHENYLEPHRINE 40 MCG/ML (10ML) SYRINGE FOR IV PUSH (FOR BLOOD PRESSURE SUPPORT)
PREFILLED_SYRINGE | INTRAVENOUS | Status: DC | PRN
  Administered 2021-06-13: 80 ug via INTRAVENOUS
  Administered 2021-06-13: 40 ug via INTRAVENOUS
  Administered 2021-06-13: 80 ug via INTRAVENOUS

## 2021-06-13 MED ORDER — PHENYLEPHRINE HCL-NACL 20-0.9 MG/250ML-% IV SOLN
INTRAVENOUS | Status: DC | PRN
  Administered 2021-06-13: 25 ug/min via INTRAVENOUS

## 2021-06-13 MED ORDER — HEPARIN (PORCINE) IN NACL 2000-0.9 UNIT/L-% IV SOLN
INTRAVENOUS | Status: AC
  Filled 2021-06-13: qty 1000

## 2021-06-13 MED ORDER — SUGAMMADEX SODIUM 200 MG/2ML IV SOLN
INTRAVENOUS | Status: DC | PRN
  Administered 2021-06-13: 200 mg via INTRAVENOUS

## 2021-06-13 MED ORDER — FENTANYL CITRATE (PF) 100 MCG/2ML IJ SOLN
INTRAMUSCULAR | Status: AC
  Filled 2021-06-13: qty 2

## 2021-06-13 MED ORDER — ONDANSETRON HCL 4 MG/2ML IJ SOLN
INTRAMUSCULAR | Status: DC | PRN
  Administered 2021-06-13: 4 mg via INTRAVENOUS

## 2021-06-13 MED ORDER — POTASSIUM CHLORIDE 10 MEQ/100ML IV SOLN
10.0000 meq | INTRAVENOUS | Status: AC
  Administered 2021-06-13 (×4): 10 meq via INTRAVENOUS
  Filled 2021-06-13 (×4): qty 100

## 2021-06-13 MED ORDER — SODIUM CHLORIDE 0.9% FLUSH: 3.0000 mL | INTRAVENOUS | Status: DC | PRN

## 2021-06-13 MED ORDER — DEXAMETHASONE SODIUM PHOSPHATE 10 MG/ML IJ SOLN
INTRAMUSCULAR | Status: DC | PRN
  Administered 2021-06-13: 10 mg via INTRAVENOUS

## 2021-06-13 MED ORDER — FENTANYL CITRATE (PF) 250 MCG/5ML IJ SOLN
INTRAMUSCULAR | Status: DC | PRN
  Administered 2021-06-13: 100 ug via INTRAVENOUS

## 2021-06-13 MED ORDER — ETOMIDATE 2 MG/ML IV SOLN
INTRAVENOUS | Status: DC | PRN
  Administered 2021-06-13: 12 mg via INTRAVENOUS

## 2021-06-13 MED ORDER — SODIUM CHLORIDE 0.9 % IV SOLN: INTRAVENOUS | Status: DC

## 2021-06-13 MED ORDER — SUCCINYLCHOLINE CHLORIDE 200 MG/10ML IV SOSY
PREFILLED_SYRINGE | INTRAVENOUS | Status: DC | PRN
  Administered 2021-06-13: 140 mg via INTRAVENOUS

## 2021-06-13 SURGICAL SUPPLY — 13 items
BAG SNAP BAND KOVER 36X36 (MISCELLANEOUS) ×2 IMPLANT
CATH EZ STEER NAV 4MM F-J CUR (ABLATOR) IMPLANT
CATH JOSEPH QUAD ALLRED 6F REP (CATHETERS) ×2 IMPLANT
CATH SMTCH THERMOCOOL SF FJ (CATHETERS) ×2 IMPLANT
CATH WEBSTER BI DIR CS D-F CRV (CATHETERS) ×2 IMPLANT
PACK EP LATEX FREE (CUSTOM PROCEDURE TRAY) ×2
PACK EP LF (CUSTOM PROCEDURE TRAY) ×1 IMPLANT
PAD DEFIB RADIO PHYSIO CONN (PAD) ×2 IMPLANT
PATCH CARTO3 (PAD) ×2 IMPLANT
SHEATH PINNACLE 6F 10CM (SHEATH) ×2 IMPLANT
SHEATH PINNACLE 7F 10CM (SHEATH) ×2 IMPLANT
SHEATH PINNACLE 8F 10CM (SHEATH) ×2 IMPLANT
TUBING SMART ABLATE COOLFLOW (TUBING) ×2 IMPLANT

## 2021-06-13 NOTE — Anesthesia Preprocedure Evaluation (Signed)
Anesthesia Evaluation  Patient identified by MRN, date of birth, ID band Patient awake    Reviewed: Allergy & Precautions, NPO status , Patient's Chart, lab work & pertinent test results  Airway Mallampati: II  TM Distance: >3 FB     Dental   Pulmonary former smoker,    breath sounds clear to auscultation       Cardiovascular hypertension, +CHF   Rhythm:Regular Rate:Normal     Neuro/Psych    GI/Hepatic negative GI ROS, Neg liver ROS,   Endo/Other  negative endocrine ROS  Renal/GU negative Renal ROS     Musculoskeletal   Abdominal   Peds  Hematology   Anesthesia Other Findings   Reproductive/Obstetrics                             Anesthesia Physical Anesthesia Plan  ASA: 3  Anesthesia Plan: General   Post-op Pain Management:    Induction:   PONV Risk Score and Plan: 3 and Ondansetron, Dexamethasone and Midazolam  Airway Management Planned: Oral ETT  Additional Equipment:   Intra-op Plan:   Post-operative Plan: Extubation in OR  Informed Consent: I have reviewed the patients History and Physical, chart, labs and discussed the procedure including the risks, benefits and alternatives for the proposed anesthesia with the patient or authorized representative who has indicated his/her understanding and acceptance.     Dental advisory given  Plan Discussed with: CRNA, Anesthesiologist and Surgeon  Anesthesia Plan Comments:         Anesthesia Quick Evaluation

## 2021-06-13 NOTE — Anesthesia Postprocedure Evaluation (Signed)
Anesthesia Post Note  Patient: Bryan Pace  Procedure(s) Performed: A-FLUTTER ABLATION     Patient location during evaluation: PACU Anesthesia Type: General Level of consciousness: awake Pain management: pain level controlled Vital Signs Assessment: post-procedure vital signs reviewed and stable Cardiovascular status: stable Postop Assessment: no apparent nausea or vomiting Anesthetic complications: no   No notable events documented.  Last Vitals:  Vitals:   06/13/21 1030 06/13/21 1045  BP: (!) 158/95 (!) 161/84  Pulse: 70 69  Resp: 16 19  Temp:    SpO2: 98% 100%    Last Pain:  Vitals:   06/13/21 1000  TempSrc:   PainSc: 0-No pain                 [name]
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
## 2021-06-13 NOTE — Anesthesia Procedure Notes (Signed)
Procedure Name: Intubation Date/Time: 06/13/2021 7:44 AM Performed by: Carolan Clines, CRNA Pre-anesthesia Checklist: Patient identified, Emergency Drugs available, Suction available and Patient being monitored Patient Re-evaluated:Patient Re-evaluated prior to induction Oxygen Delivery Method: Circle System Utilized Preoxygenation: Pre-oxygenation with 100% oxygen Induction Type: IV induction and Rapid sequence Laryngoscope Size: Mac and 4 Grade View: Grade II Tube type: Oral Tube size: 7.5 mm Number of attempts: 1 Airway Equipment and Method: Stylet Placement Confirmation: ETT inserted through vocal cords under direct vision, positive ETCO2 and breath sounds checked- equal and bilateral Secured at: 22 cm Tube secured with: Tape Dental Injury: Teeth and Oropharynx as per pre-operative assessment

## 2021-06-13 NOTE — Progress Notes (Signed)
Site area: rt groin fv sheaths x3 Site Prior to Removal:  Level 0 Pressure Applied For: 20 minutes Manual:   yes Patient Status During Pull:  rt dp palpable Post Pull Site:  Level 0 Post Pull Instructions Given:  yes Post Pull Pulses Present: rt dp palpable Dressing Applied:  gauze and tegaderm Bedrest begins @ 0940 Comments: KCL replacement infusion rate decreased to 50cc/hr; patient states burning at IV site is less

## 2021-06-13 NOTE — Anesthesia Procedure Notes (Signed)
Arterial Line Insertion Start/End12/03/2021 7:10 AM Performed by: Carolan Clines, CRNA, CRNA  Patient location: Pre-op. Preanesthetic checklist: patient identified, IV checked, site marked, risks and benefits discussed, surgical consent, monitors and equipment checked, pre-op evaluation, timeout performed and anesthesia consent Lidocaine 1% used for infiltration Right, radial was placed Catheter size: 20 G Hand hygiene performed  and maximum sterile barriers used   Attempts: 1 Procedure performed without using ultrasound guided technique. Following insertion, dressing applied and Biopatch. Post procedure assessment: normal and unchanged  Patient tolerated the procedure well with no immediate complications.

## 2021-06-13 NOTE — Discharge Instructions (Addendum)
Post procedure care instructions No driving for 4 days. No lifting over 5 lbs for 1 week. No vigorous or sexual activity for 1 week. You may return to work/your usual activities on 06/21/21. Keep procedure site clean & dry. If you notice increased pain, swelling, bleeding or pus, call/return!  You may shower after 24 hours, but no soaking in baths/hot tubs/pools for 1 week. ]    Cardiac Ablation, Care After  This sheet gives you information about how to care for yourself after your procedure. Your health care provider may also give you more specific instructions. If you have problems or questions, contact your health care provider. What can I expect after the procedure? After the procedure, it is common to have: Bruising around your puncture site. Tenderness around your puncture site. Skipped heartbeats. Tiredness (fatigue).  Follow these instructions at home: Puncture site care  Follow instructions from your health care provider about how to take care of your puncture site. Make sure you: If present, leave stitches (sutures), skin glue, or adhesive strips in place. These skin closures may need to stay in place for up to 2 weeks. If adhesive strip edges start to loosen and curl up, you may trim the loose edges. Do not remove adhesive strips completely unless your health care provider tells you to do that. If a square bandage is present, this may be removed in 24 hours.  Check your puncture site every day for signs of infection. Check for: Redness, swelling, or pain. Fluid or blood. If your puncture site starts to bleed, lie down on your back, apply firm pressure to the area, and contact your health care provider. Warmth. Pus or a bad smell. Driving Do not drive for at least 4 days after your procedure or however long your health care provider recommends. (Do not resume driving if you have previously been instructed not to drive for other health reasons.) Do not drive or use heavy machinery  while taking prescription pain medicine. Activity Avoid activities that take a lot of effort for at least 7 days after your procedure. Do not lift anything that is heavier than 5 lb (4.5 kg) for one week.  No sexual activity for 1 week.  Return to your normal activities as told by your health care provider. Ask your health care provider what activities are safe for you. General instructions Take over-the-counter and prescription medicines only as told by your health care provider. Do not use any products that contain nicotine or tobacco, such as cigarettes and e-cigarettes. If you need help quitting, ask your health care provider. You may shower after 24 hours, but Do not take baths, swim, or use a hot tub for 1 week.  Do not drink alcohol for 24 hours after your procedure. Keep all follow-up visits as told by your health care provider. This is important. Contact a health care provider if: You have redness, mild swelling, or pain around your puncture site. You have fluid or blood coming from your puncture site that stops after applying firm pressure to the area. Your puncture site feels warm to the touch. You have pus or a bad smell coming from your puncture site. You have a fever. You have chest pain or discomfort that spreads to your neck, jaw, or arm. You are sweating a lot. You feel nauseous. You have a fast or irregular heartbeat. You have shortness of breath. You are dizzy or light-headed and feel the need to lie down. You have pain or numbness in  the arm or leg closest to your puncture site. Get help right away if: Your puncture site suddenly swells. Your puncture site is bleeding and the bleeding does not stop after applying firm pressure to the area. These symptoms may represent a serious problem that is an emergency. Do not wait to see if the symptoms will go away. Get medical help right away. Call your local emergency services (911 in the [place] Do not drive yourself to the  hospital. Summary After the procedure, it is normal to have bruising and tenderness at the puncture site in your groin, neck, or forearm. Check your puncture site every day for signs of infection. Get help right away if your puncture site is bleeding and the bleeding does not stop after applying firm pressure to the area. This is a medical emergency. This information is not intended to replace advice given to you by your health care provider. Make sure you discuss any questions you have with your health care provider.

## 2021-06-13 NOTE — Transfer of Care (Signed)
Immediate Anesthesia Transfer of Care Note  Patient: Bryan Pace  Procedure(s) Performed: A-FLUTTER ABLATION  Patient Location: Cath Lab  Anesthesia Type:General  Level of Consciousness: awake, alert  and oriented  Airway & Oxygen Therapy: Patient Spontanous Breathing  Post-op Assessment: Report given to RN and Post -op Vital signs reviewed and stable  Post vital signs: Reviewed and stable  Last Vitals:  Vitals Value Taken Time  BP 160/89   Temp    Pulse 67 06/13/21 0908  Resp 15 06/13/21 0908  SpO2 100 % 06/13/21 0908  Vitals shown include unvalidated device data.  Last Pain:  Vitals:   06/13/21 0545  PainSc: 0-No pain      Patients Stated Pain Goal: 3 (99/96/72 2773)  Complications: No notable events documented.

## 2021-06-16 ENCOUNTER — Encounter (HOSPITAL_COMMUNITY): Payer: Self-pay | Admitting: Internal Medicine

## 2021-06-16 ENCOUNTER — Other Ambulatory Visit (HOSPITAL_COMMUNITY)
Admission: RE | Admit: 2021-06-16 | Discharge: 2021-06-16 | Disposition: A | Discharge status: Home / Self Care | Payer: BC Managed Care – PPO | Source: Ambulatory Visit | Attending: Internal Medicine | Admitting: Internal Medicine

## 2021-06-16 DIAGNOSIS — I110 Hypertensive heart disease with heart failure: Secondary | ICD-10-CM | POA: Diagnosis not present

## 2021-06-16 DIAGNOSIS — Z79899 Other long term (current) drug therapy: Secondary | ICD-10-CM | POA: Insufficient documentation

## 2021-06-16 DIAGNOSIS — I5022 Chronic systolic (congestive) heart failure: Secondary | ICD-10-CM | POA: Diagnosis not present

## 2021-06-16 DIAGNOSIS — I483 Typical atrial flutter: Secondary | ICD-10-CM | POA: Diagnosis not present

## 2021-06-16 LAB — BASIC METABOLIC PANEL
Anion gap: 2 — ABNORMAL LOW (ref 5–15)
BUN: 10 mg/dL (ref 6–20)
CO2: 30 mmol/L (ref 22–32)
Calcium: 8.5 mg/dL — ABNORMAL LOW (ref 8.9–10.3)
Chloride: 105 mmol/L (ref 98–111)
Creatinine, Ser: 0.94 mg/dL (ref 0.61–1.24)
GFR, Estimated: >60 mL/min (ref >60)
Glucose, Bld: 125 mg/dL — ABNORMAL HIGH (ref 70–99)
Potassium: 3.1 mmol/L — ABNORMAL LOW (ref 3.5–5.1)
Sodium: 137 mmol/L (ref 135–145)

## 2021-06-16 MED FILL — Heparin Sodium (Porcine) Inj 1000 Unit/ML: INTRAMUSCULAR | Qty: 10 | Status: AC

## 2021-06-17 ENCOUNTER — Telehealth: Payer: Self-pay

## 2021-06-17 DIAGNOSIS — Z79899 Other long term (current) drug therapy: Secondary | ICD-10-CM

## 2021-06-17 MED ORDER — POTASSIUM CHLORIDE CRYS ER 20 MEQ PO TBCR: 20.0000 meq | EXTENDED_RELEASE_TABLET | Freq: Every day | ORAL | 3 refills | Status: AC

## 2021-06-17 NOTE — Telephone Encounter (Signed)
-----   Message from Evans Lance, MD sent at 06/16/2021  8:58 PM EST ----- Potassium is still low but better. He will need 20 meq of Kdur daily and a repeat bmp in 2 weeks.

## 2021-06-19 ENCOUNTER — Ambulatory Visit: Payer: BC Managed Care – PPO | Admitting: Cardiology

## 2021-06-20 NOTE — Telephone Encounter (Signed)
Order placed for potassium.  Pt will need BMP scheduled in 2 weeks.   Left message requesting call back to determine where he wants to get lab work.

## 2021-06-20 NOTE — Telephone Encounter (Signed)
Patient will have bmet in 2 weeks at [facility] out patient lab

## 2021-07-04 ENCOUNTER — Other Ambulatory Visit: Payer: Self-pay

## 2021-07-04 ENCOUNTER — Other Ambulatory Visit (HOSPITAL_COMMUNITY)
Admission: RE | Admit: 2021-07-04 | Discharge: 2021-07-04 | Disposition: A | Discharge status: Home / Self Care | Payer: BC Managed Care – PPO | Source: Ambulatory Visit | Attending: Cardiology | Admitting: Cardiology

## 2021-07-04 DIAGNOSIS — Z79899 Other long term (current) drug therapy: Secondary | ICD-10-CM | POA: Insufficient documentation

## 2021-07-04 LAB — BASIC METABOLIC PANEL
Anion gap: 9 (ref 5–15)
BUN: 11 mg/dL (ref 6–20)
CO2: 24 mmol/L (ref 22–32)
Calcium: 9.1 mg/dL (ref 8.9–10.3)
Chloride: 104 mmol/L (ref 98–111)
Creatinine, Ser: 0.89 mg/dL (ref 0.61–1.24)
GFR, Estimated: >60 mL/min (ref >60)
Glucose, Bld: 109 mg/dL — ABNORMAL HIGH (ref 70–99)
Potassium: 4.9 mmol/L (ref 3.5–5.1)
Sodium: 137 mmol/L (ref 135–145)

## 2021-07-08 ENCOUNTER — Telehealth: Payer: Self-pay | Admitting: *Deleted

## 2021-07-08 NOTE — Telephone Encounter (Signed)
Pt notified of labs and that ordering provider will result. Pt thankful for the call back.

## 2021-07-08 NOTE — Telephone Encounter (Signed)
Pt calling for lab results. Lab drawn on 07/04/21. Please advise.

## 2021-07-18 ENCOUNTER — Telehealth: Payer: Self-pay

## 2021-07-18 NOTE — Telephone Encounter (Signed)
Patient notified and verbalized understanding. Pt had no questions or concerns at this time 

## 2021-07-18 NOTE — Telephone Encounter (Signed)
-----   Message from Arnoldo Lenis, MD sent at 07/16/2021  4:02 PM EST ----- Normal labs  Zandra Abts MD

## 2021-07-22 ENCOUNTER — Ambulatory Visit: Payer: BC Managed Care – PPO | Admitting: Student

## 2021-07-22 ENCOUNTER — Other Ambulatory Visit: Payer: Self-pay

## 2021-07-22 ENCOUNTER — Encounter: Payer: Self-pay | Admitting: Student

## 2021-07-22 VITALS — BP 128/84 | HR 66 | Ht 72.0 in | Wt 171.0 lb

## 2021-07-22 DIAGNOSIS — E871 Hypo-osmolality and hyponatremia: Secondary | ICD-10-CM | POA: Diagnosis not present

## 2021-07-22 DIAGNOSIS — I5022 Chronic systolic (congestive) heart failure: Secondary | ICD-10-CM | POA: Diagnosis not present

## 2021-07-22 DIAGNOSIS — I4892 Unspecified atrial flutter: Secondary | ICD-10-CM

## 2021-07-22 MED ORDER — DIGOXIN 125 MCG PO TABS: 0.1250 mg | ORAL_TABLET | Freq: Every day | ORAL | 1 refills | Status: DC

## 2021-07-22 MED ORDER — SPIRONOLACTONE 25 MG PO TABS: 25.0000 mg | ORAL_TABLET | Freq: Every day | ORAL | 1 refills | Status: DC

## 2021-07-22 MED ORDER — ENTRESTO 24-26 MG PO TABS: 1.0000 | ORAL_TABLET | Freq: Two times a day (BID) | ORAL | 3 refills | Status: DC

## 2021-07-22 MED ORDER — METOPROLOL SUCCINATE ER 100 MG PO TB24: 100.0000 mg | ORAL_TABLET | Freq: Two times a day (BID) | ORAL | 1 refills | Status: DC

## 2021-07-22 NOTE — Patient Instructions (Signed)
Medication Instructions:  Your physician recommends that you continue on your current medications as directed. Please refer to the Current Medication list given to you today.  *If you need a refill on your cardiac medications before your next appointment, please call your pharmacy*   Lab Work: NONE   If you have labs (blood work) drawn today and your tests are completely normal, you will receive your results only by: [place] (if you have MyChart) OR A paper copy in the mail If you have any lab test that is abnormal or we need to change your treatment, we will call you to review the results.   Testing/Procedures: Your physician has requested that you have an echocardiogram. Echocardiography is a painless test that uses sound waves to create images of your heart. It provides your doctor with information about the size and shape of your heart and how well your hearts chambers and valves are working. This procedure takes approximately one hour. There are no restrictions for this procedure.    Follow-Up: At [facility], you and your health needs are our priority.  As part of our continuing mission to provide you with exceptional heart care, we have created designated Provider Care Teams.  These Care Teams include your primary Cardiologist (physician) and Advanced Practice Providers (APPs -  Physician Assistants and Nurse Practitioners) who all work together to provide you with the care you need, when you need it.  We recommend signing up for the patient portal called "MyChart".  Sign up information is provided on this After Visit Summary.  MyChart is used to connect with patients for Virtual Visits (Telemedicine).  Patients are able to view lab/test results, encounter notes, upcoming appointments, etc.  Non-urgent messages can be sent to your provider as well.   To learn more about what you can do with MyChart, go to [email or web address].    Your next appointment:   3  month(s)  The format for your next appointment:   In Person  Provider:   Carlyle Dolly, MD or Bernerd Pho, PA-C    Other Instructions Thank you for choosing [facility]!
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
## 2021-08-05 ENCOUNTER — Encounter: Payer: Self-pay | Admitting: Internal Medicine

## 2021-08-05 ENCOUNTER — Other Ambulatory Visit: Payer: Self-pay

## 2021-08-05 ENCOUNTER — Ambulatory Visit (INDEPENDENT_AMBULATORY_CARE_PROVIDER_SITE_OTHER): Payer: BC Managed Care – PPO | Admitting: Internal Medicine

## 2021-08-05 VITALS — BP 118/68 | HR 68 | Ht 72.0 in | Wt 169.0 lb

## 2021-08-05 DIAGNOSIS — I4892 Unspecified atrial flutter: Secondary | ICD-10-CM | POA: Diagnosis not present
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
## 2021-08-05 NOTE — Patient Instructions (Signed)
Medication Instructions:  °Your physician recommends that you continue on your current medications as directed. Please refer to the Current Medication list given to you today. ° °*If you need a refill on your cardiac medications before your next appointment, please call your pharmacy* ° ° °Lab Work: °NONE  ° °If you have labs (blood work) drawn today and your tests are completely normal, you will receive your results only by: °MyChart Message (if you have MyChart) OR °A paper copy in the mail °If you have any lab test that is abnormal or we need to change your treatment, we will call you to review the results. ° ° °Testing/Procedures: °NONE  ° ° °Follow-Up: °At [facility], you and your health needs are our priority.  As part of our continuing mission to provide you with exceptional heart care, we have created designated Provider Care Teams.  These Care Teams include your primary Cardiologist (physician) and Advanced Practice Providers (APPs -  Physician Assistants and Nurse Practitioners) who all work together to provide you with the care you need, when you need it. ° °We recommend signing up for the patient portal called "MyChart".  Sign up information is provided on this After Visit Summary.  MyChart is used to connect with patients for Virtual Visits (Telemedicine).  Patients are able to view lab/test results, encounter notes, upcoming appointments, etc.  Non-urgent messages can be sent to your provider as well.   °To learn more about what you can do with MyChart, go to [email or web address].   ° °Your next appointment:   ° As Needed  ° °The format for your next appointment:   °In Person ° °Provider:   °Gregg Taylor, MD  ° ° °Other Instructions °Thank you for choosing [facility]! °  ° ° °

## 2021-08-22 ENCOUNTER — Other Ambulatory Visit: Payer: Self-pay

## 2021-08-22 ENCOUNTER — Ambulatory Visit (HOSPITAL_COMMUNITY)
Admission: RE | Admit: 2021-08-22 | Discharge: 2021-08-22 | Disposition: A | Discharge status: Home / Self Care | Payer: BC Managed Care – PPO | Source: Ambulatory Visit | Attending: Internal Medicine | Admitting: Internal Medicine

## 2021-08-22 DIAGNOSIS — I5022 Chronic systolic (congestive) heart failure: Secondary | ICD-10-CM | POA: Diagnosis not present

## 2021-08-22 LAB — ECHOCARDIOGRAM COMPLETE
Area-P 1/2: 2.83 cm2
Calc EF: 55.3 %
S' Lateral: 3.4 cm
Single Plane A2C EF: 55 %
Single Plane A4C EF: 55 %

## 2021-08-22 NOTE — Progress Notes (Signed)
*  PRELIMINARY RESULTS* Echocardiogram 2D Echocardiogram has been performed.  Bryan Pace 08/22/2021, 10:05 AM

## 2021-10-24 ENCOUNTER — Ambulatory Visit: Payer: BC Managed Care – PPO | Admitting: Cardiology

## 2021-10-24 ENCOUNTER — Encounter: Payer: Self-pay | Admitting: Cardiology

## 2021-10-24 VITALS — BP 138/82 | HR 68 | Ht 72.0 in | Wt 169.0 lb

## 2021-10-24 DIAGNOSIS — I4892 Unspecified atrial flutter: Secondary | ICD-10-CM | POA: Diagnosis not present

## 2021-10-24 DIAGNOSIS — I10 Essential (primary) hypertension: Secondary | ICD-10-CM | POA: Diagnosis not present

## 2021-10-24 DIAGNOSIS — I5022 Chronic systolic (congestive) heart failure: Secondary | ICD-10-CM | POA: Diagnosis not present
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
## 2021-10-24 NOTE — Patient Instructions (Signed)
Medication Instructions:  ?Your physician has recommended you make the following change in your medication:  ? ?Stop Taking Digoxin  ? ? ?*If you need a refill on your cardiac medications before your next appointment, please call your pharmacy* ? ? ?Lab Work: ?NONE  ? ?If you have labs (blood work) drawn today and your tests are completely normal, you will receive your results only by: ?MyChart Message (if you have MyChart) OR ?A paper copy in the mail ?If you have any lab test that is abnormal or we need to change your treatment, we will call you to review the results. ? ? ?Testing/Procedures: ?NONE  ? ? ?Follow-Up: ?At [facility], you and your health needs are our priority.  As part of our continuing mission to provide you with exceptional heart care, we have created designated Provider Care Teams.  These Care Teams include your primary Cardiologist (physician) and Advanced Practice Providers (APPs -  Physician Assistants and Nurse Practitioners) who all work together to provide you with the care you need, when you need it. ? ?We recommend signing up for the patient portal called "MyChart".  Sign up information is provided on this After Visit Summary.  MyChart is used to connect with patients for Virtual Visits (Telemedicine).  Patients are able to view lab/test results, encounter notes, upcoming appointments, etc.  Non-urgent messages can be sent to your provider as well.   ?To learn more about what you can do with MyChart, go to [email or web address].   ? ?Your next appointment:   ?6 month(s) ? ?The format for your next appointment:   ?In Person ? ?Provider:   ?Carlyle Dolly, MD  ? ? ?Other Instructions ?Thank you for choosing [facility]! ? ? ? ?Important Information About Sugar ? ? ? ? ? ? ?

## 2022-02-14 IMAGING — CT CT ANGIO CHEST
2 of 6 series · 18 of 36 positions shown · IV contrast (Omnipaque or Isovue)
Comparison: Chest x-ray 05/04/2021

CLINICAL DATA: Patient complains of shortness of breath and some
chest discomfort.

EXAM:
CT ANGIOGRAPHY CHEST WITH CONTRAST
TECHNIQUE: Multidetector CT imaging of the chest was performed using the
standard protocol during bolus administration of intravenous
contrast. Multiplanar CT image reconstructions and MIPs were
obtained to evaluate the vascular anatomy.
CONTRAST:  Intravenous contrast administered.

[Series 5: pe axial thins · axial · 0.96mm/px · z∈[+950,+1266]mm · 17 of 437 slices shown]
[im 21/437  lung]
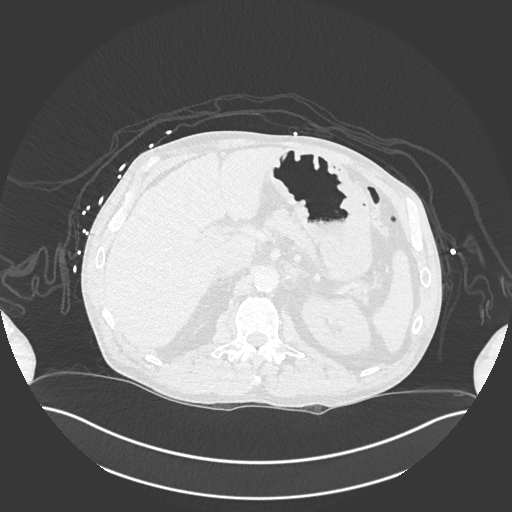
[im 42/437  mediastinal]
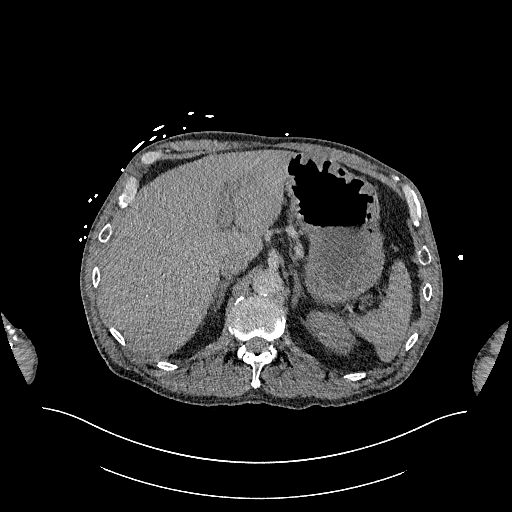
[im 63/437  lung]
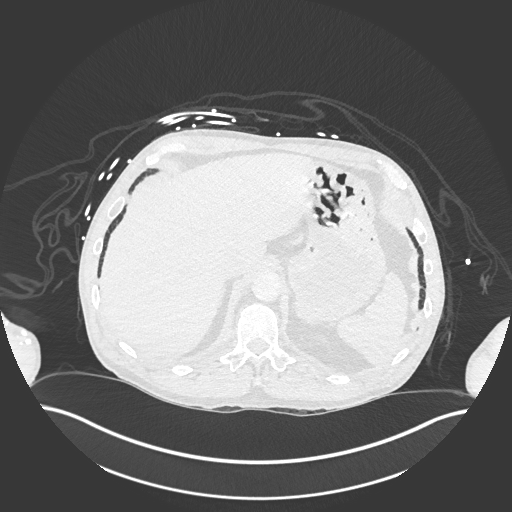
[im 104/437  mediastinal]
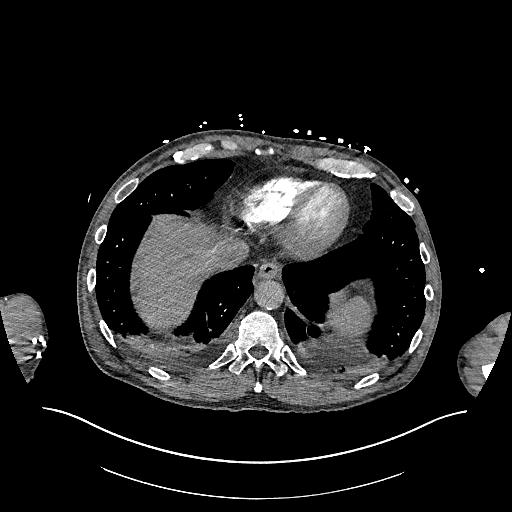
[im 125/437  lung]
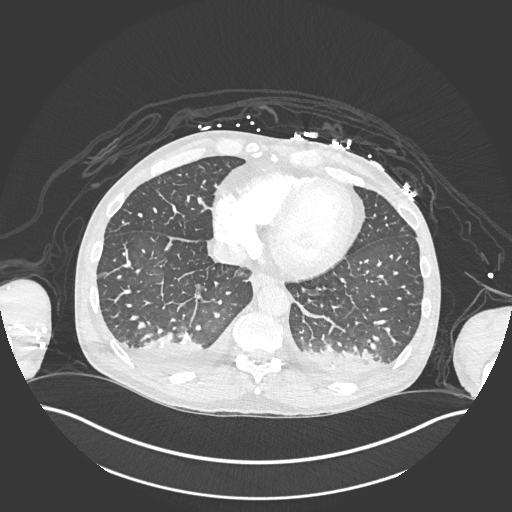
[im 146/437  mediastinal]
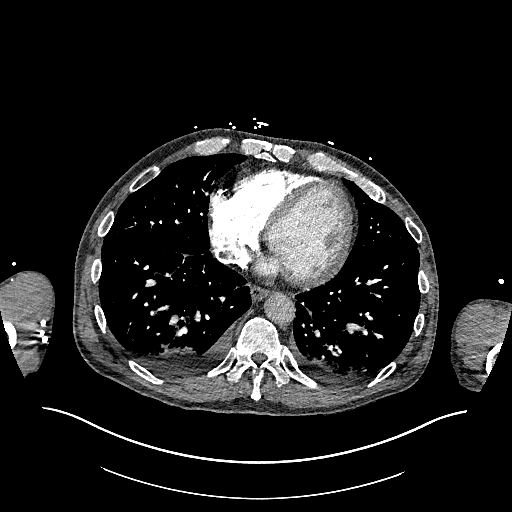
[im 167/437  lung]
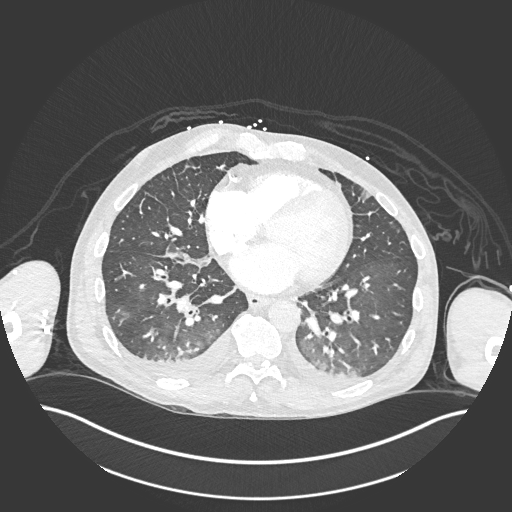
[im 187/437  mediastinal]
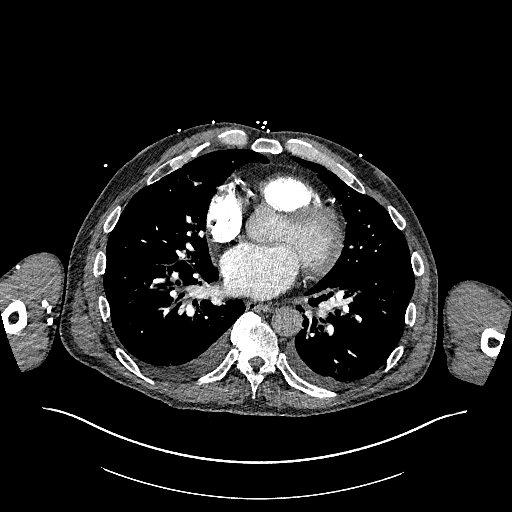
[im 229/437  lung]
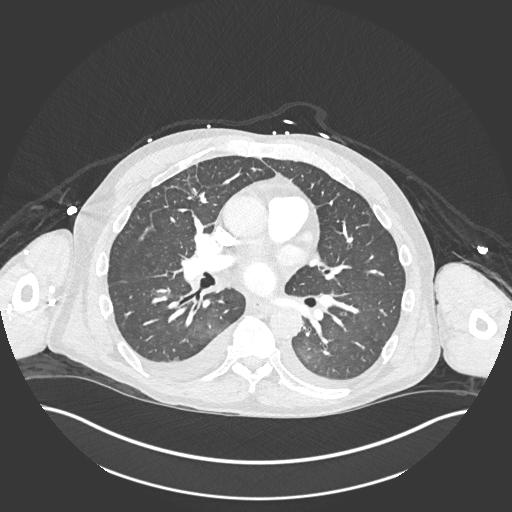
[im 250/437  mediastinal]
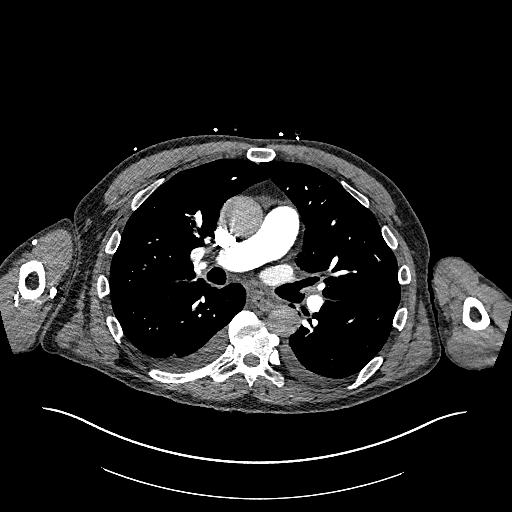
[im 270/437  lung]
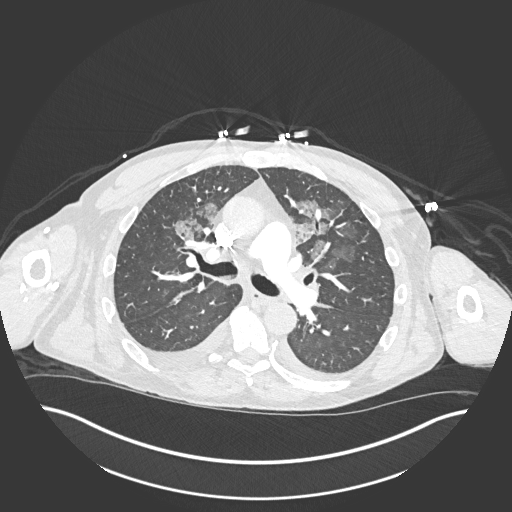
[im 291/437  mediastinal]
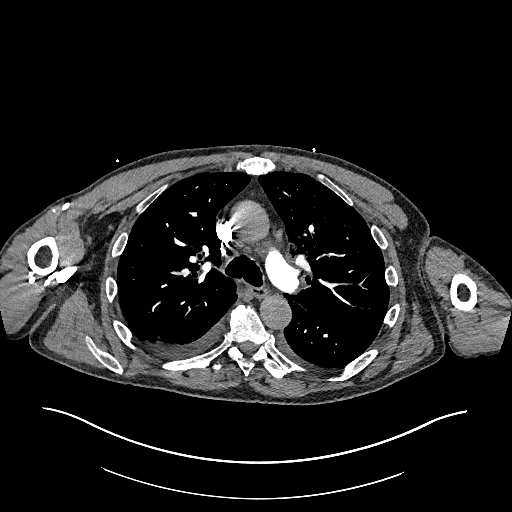
[im 312/437  lung]
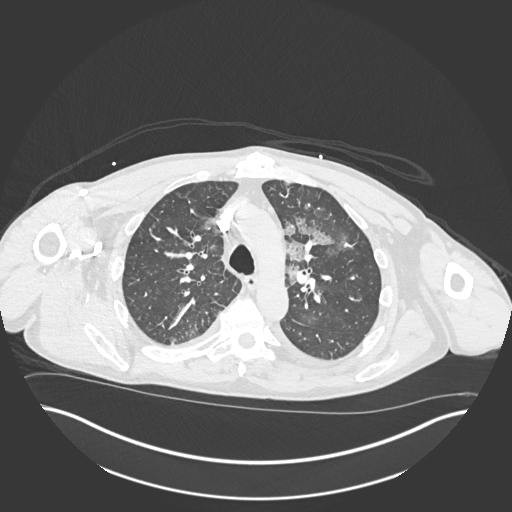
[im 333/437  mediastinal]
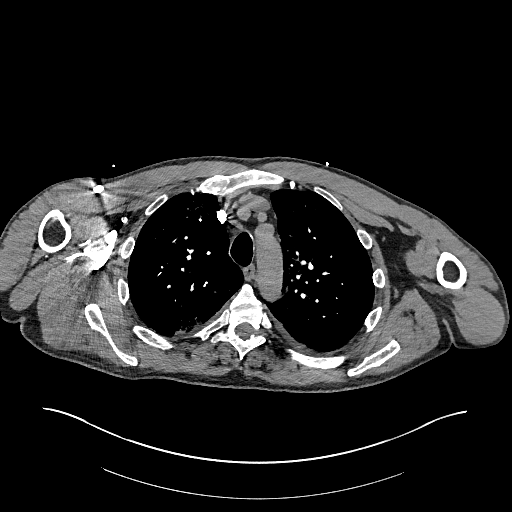
[im 374/437  lung]
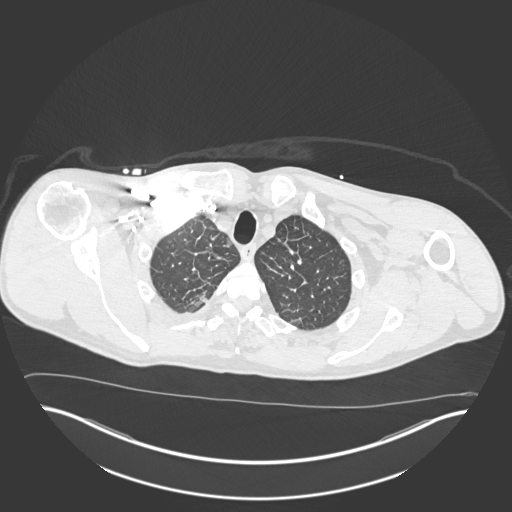
[im 395/437  mediastinal]
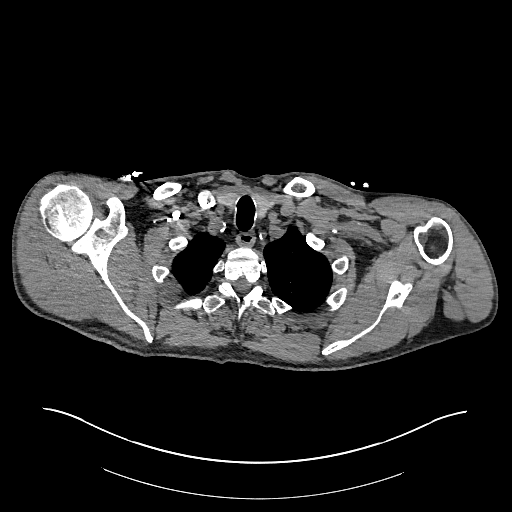
[im 416/437  lung]
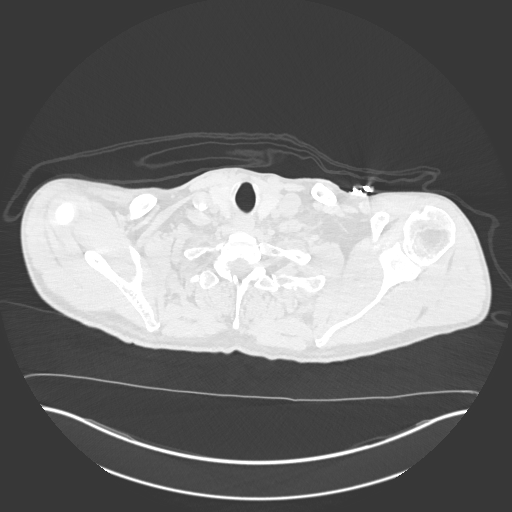

[Series 8: cor soft · coronal · 0.74mm/px · 1 of 137 slices shown]
[im 69/137  mediastinal]
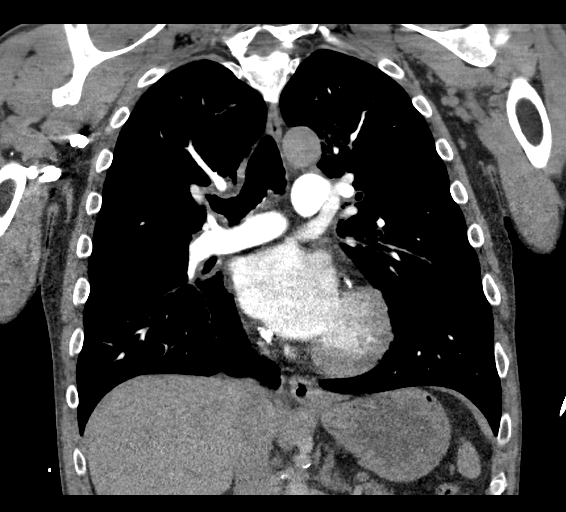

[18 of 36 positions shown; findings below may reference images not displayed]

FINDINGS: Cardiovascular: Satisfactory opacification of the pulmonary arteries
to the segmental level. No evidence of pulmonary embolism. The main
pulmonary artery is enlarged in caliber measuring to 3.3 cm. Normal
heart size. No significant pericardial effusion. The thoracic aorta
is normal in caliber. At least mild atherosclerotic plaque of the
thoracic aorta. Four-vessel coronary artery calcifications.

Mediastinum/Nodes: No enlarged mediastinal, hilar, or axillary lymph
nodes. Thyroid gland, trachea, and esophagus demonstrate no
significant findings.

Lungs/Pleura: Interlobular septal wall thickening within bilateral
upper lobes. Peribronchovascular ground-glass airspace opacities
which is most prominent within the bilateral upper lobes. No focal
consolidation. Few scattered pulmonary micronodules in the right
lung. No pulmonary mass. Bilateral trace pleural effusions. No
pneumothorax. Debris noted within the trachea and right mainstem
bronchi.

Upper Abdomen: No acute abnormality.

Musculoskeletal:

No chest wall abnormality.

No suspicious lytic or blastic osseous lesions. No acute displaced
fracture.

Review of the MIP images confirms the above findings.
IMPRESSION: 1. No pulmonary embolus.
2. Pulmonary edema with bilateral trace pleural effusions.
Superimposed infection/inflammation not excluded with
peribronchovascular ground-glass airspace opacities.
3. Debris noted within the trachea and right mainstem bronchi.

## 2022-02-14 IMAGING — DX DG CHEST 1V PORT
1 series · 1 of 1 positions shown · non-contrast
Comparison: Prior chest radiographs 04/24/2021 and earlier.

CLINICAL DATA: Provided history: Shortness of breath. Additional
history provided: History of atrial flutter, hypertension, current
smoker.

EXAM:
PORTABLE CHEST 1 VIEW

[chest ap]
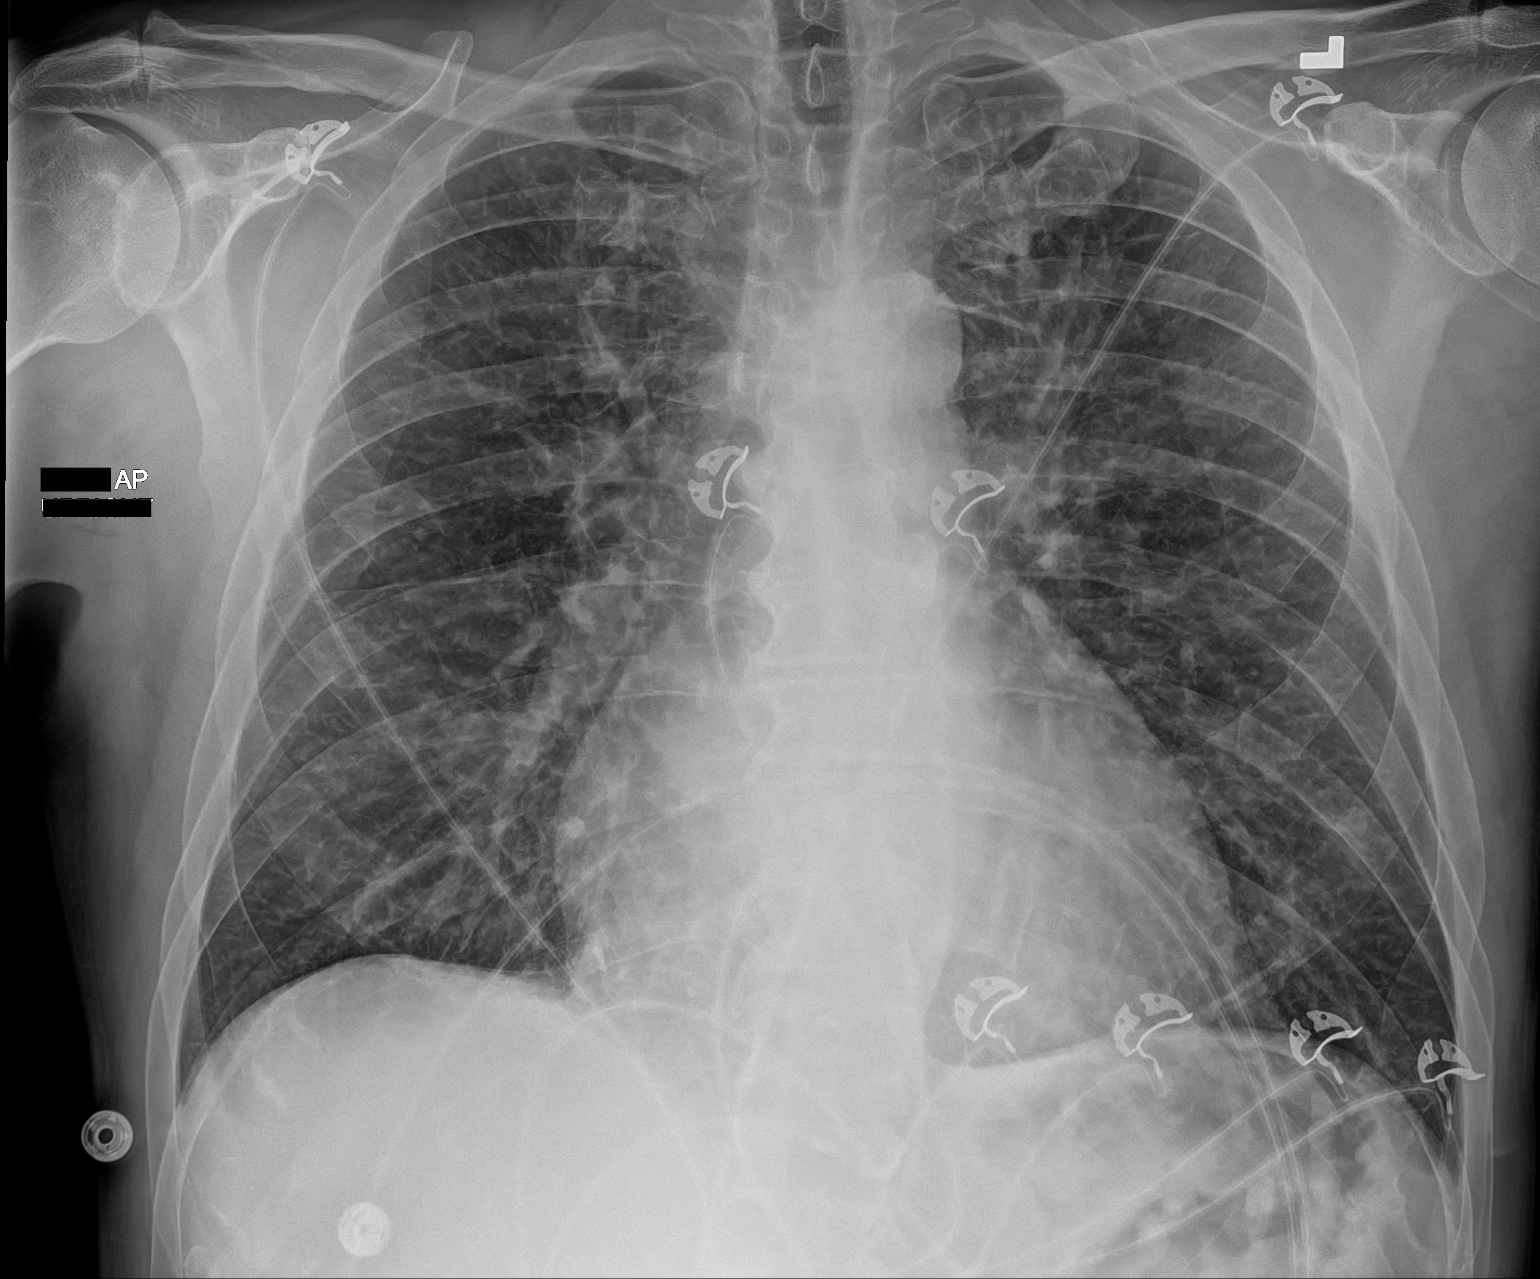

[1 of 1 positions shown; findings below may reference images not displayed]

FINDINGS: Heart size at the upper limits of normal, unchanged. No appreciable
airspace consolidation or pulmonary edema. No evidence of pleural
effusion on this [facility] portable radiograph. No evidence of
pneumothorax. No acute bony abnormality identified. Degenerative
changes of the spine.
IMPRESSION: No evidence of acute cardiopulmonary abnormality.

## 2022-04-09 DIAGNOSIS — Z0001 Encounter for general adult medical examination with abnormal findings: Secondary | ICD-10-CM | POA: Diagnosis not present

## 2022-04-16 DIAGNOSIS — Z0001 Encounter for general adult medical examination with abnormal findings: Secondary | ICD-10-CM | POA: Diagnosis not present

## 2022-04-16 DIAGNOSIS — Z23 Encounter for immunization: Secondary | ICD-10-CM | POA: Diagnosis not present

## 2022-04-17 ENCOUNTER — Other Ambulatory Visit: Payer: Self-pay | Admitting: Student

## 2022-05-07 ENCOUNTER — Ambulatory Visit: Payer: BC Managed Care – PPO | Attending: Cardiology | Admitting: Cardiology

## 2022-05-07 ENCOUNTER — Encounter: Payer: Self-pay | Admitting: Cardiology

## 2022-05-07 VITALS — BP 110/72 | HR 74 | Ht 72.0 in | Wt 169.4 lb

## 2022-05-07 DIAGNOSIS — I4892 Unspecified atrial flutter: Secondary | ICD-10-CM

## 2022-05-07 DIAGNOSIS — I10 Essential (primary) hypertension: Secondary | ICD-10-CM

## 2022-05-07 DIAGNOSIS — I5022 Chronic systolic (congestive) heart failure: Secondary | ICD-10-CM

## 2022-05-07 NOTE — Patient Instructions (Signed)
Medication Instructions:  Your physician recommends that you continue on your current medications as directed. Please refer to the Current Medication list given to you today.   Labwork: None  Testing/Procedures: None  Follow-Up: Follow up with Dr. Branch in 1 year.   Any Other Special Instructions Will Be Listed Below (If Applicable).     If you need a refill on your cardiac medications before your next appointment, please call your pharmacy.  
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
## 2022-05-11 ENCOUNTER — Encounter: Payer: Self-pay | Admitting: Internal Medicine

## 2022-08-20 DIAGNOSIS — U071 COVID-19: Secondary | ICD-10-CM | POA: Diagnosis not present

## 2022-09-11 ENCOUNTER — Other Ambulatory Visit: Payer: Self-pay | Admitting: Student

## 2022-10-09 ENCOUNTER — Other Ambulatory Visit: Payer: Self-pay | Admitting: Cardiology

## 2022-11-13 DIAGNOSIS — I10 Essential (primary) hypertension: Secondary | ICD-10-CM | POA: Diagnosis not present

## 2022-11-23 DIAGNOSIS — I110 Hypertensive heart disease with heart failure: Secondary | ICD-10-CM | POA: Diagnosis not present

## 2022-11-23 DIAGNOSIS — I10 Essential (primary) hypertension: Secondary | ICD-10-CM | POA: Diagnosis not present

## 2022-11-23 DIAGNOSIS — I5022 Chronic systolic (congestive) heart failure: Secondary | ICD-10-CM | POA: Diagnosis not present

## 2022-11-23 DIAGNOSIS — I4892 Unspecified atrial flutter: Secondary | ICD-10-CM | POA: Diagnosis not present

## 2022-11-23 DIAGNOSIS — G4700 Insomnia, unspecified: Secondary | ICD-10-CM | POA: Diagnosis not present

## 2022-11-23 DIAGNOSIS — K219 Gastro-esophageal reflux disease without esophagitis: Secondary | ICD-10-CM | POA: Diagnosis not present

## 2023-01-04 ENCOUNTER — Other Ambulatory Visit: Payer: Self-pay | Admitting: Student

## 2023-02-08 DIAGNOSIS — H40053 Ocular hypertension, bilateral: Secondary | ICD-10-CM | POA: Diagnosis not present

## 2023-04-29 ENCOUNTER — Other Ambulatory Visit: Payer: Self-pay | Admitting: Cardiology

## 2023-06-10 ENCOUNTER — Encounter: Payer: Self-pay | Admitting: Cardiology

## 2023-06-10 ENCOUNTER — Ambulatory Visit: Payer: BC Managed Care – PPO | Attending: Cardiology | Admitting: Cardiology

## 2023-06-10 VITALS — BP 110/72 | HR 74 | Ht 72.0 in | Wt 165.0 lb

## 2023-06-10 DIAGNOSIS — I4892 Unspecified atrial flutter: Secondary | ICD-10-CM

## 2023-06-10 DIAGNOSIS — I5032 Chronic diastolic (congestive) heart failure: Secondary | ICD-10-CM

## 2023-06-10 DIAGNOSIS — I10 Essential (primary) hypertension: Secondary | ICD-10-CM | POA: Diagnosis not present
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
## 2023-06-10 NOTE — Patient Instructions (Signed)
Medication Instructions:  Your physician recommends that you continue on your current medications as directed. Please refer to the Current Medication list given to you today.  *If you need a refill on your cardiac medications before your next appointment, please call your pharmacy*   Lab Work: None  If you have labs (blood work) drawn today and your tests are completely normal, you will receive your results only by: MyChart Message (if you have MyChart) OR A paper copy in the mail If you have any lab test that is abnormal or we need to change your treatment, we will call you to review the results.   Testing/Procedures: None   Follow-Up: At [facility], you and your health needs are our priority.  As part of our continuing mission to provide you with exceptional heart care, we have created designated Provider Care Teams.  These Care Teams include your primary Cardiologist (physician) and Advanced Practice Providers (APPs -  Physician Assistants and Nurse Practitioners) who all work together to provide you with the care you need, when you need it.  We recommend signing up for the patient portal called "MyChart".  Sign up information is provided on this After Visit Summary.  MyChart is used to connect with patients for Virtual Visits (Telemedicine).  Patients are able to view lab/test results, encounter notes, upcoming appointments, etc.  Non-urgent messages can be sent to your provider as well.   To learn more about what you can do with MyChart, go to [email or web address].    Your next appointment:   1 year(s)  Provider:   You may see Dina Rich, MD or one of the following Advanced Practice Providers on your designated Care Team:   Randall An, PA-C  Jacolyn Reedy, [place]     Other Instructions

## 2023-06-11 DIAGNOSIS — I10 Essential (primary) hypertension: Secondary | ICD-10-CM | POA: Diagnosis not present

## 2023-06-14 DIAGNOSIS — I10 Essential (primary) hypertension: Secondary | ICD-10-CM | POA: Diagnosis not present

## 2023-06-14 DIAGNOSIS — G4700 Insomnia, unspecified: Secondary | ICD-10-CM | POA: Diagnosis not present

## 2023-06-14 DIAGNOSIS — K219 Gastro-esophageal reflux disease without esophagitis: Secondary | ICD-10-CM | POA: Diagnosis not present

## 2023-06-14 DIAGNOSIS — Z0000 Encounter for general adult medical examination without abnormal findings: Secondary | ICD-10-CM | POA: Diagnosis not present

## 2023-06-14 DIAGNOSIS — Z23 Encounter for immunization: Secondary | ICD-10-CM | POA: Diagnosis not present

## 2023-10-24 ENCOUNTER — Other Ambulatory Visit: Payer: Self-pay | Admitting: Cardiology

## 2023-10-24 ENCOUNTER — Other Ambulatory Visit: Payer: Self-pay | Admitting: Student

## 2024-01-20 DIAGNOSIS — I10 Essential (primary) hypertension: Secondary | ICD-10-CM | POA: Diagnosis not present

## 2024-01-20 DIAGNOSIS — Z125 Encounter for screening for malignant neoplasm of prostate: Secondary | ICD-10-CM | POA: Diagnosis not present

## 2024-01-23 ENCOUNTER — Other Ambulatory Visit: Payer: Self-pay | Admitting: Cardiology

## 2024-01-25 ENCOUNTER — Encounter: Payer: Self-pay | Admitting: Family Medicine

## 2024-01-25 DIAGNOSIS — E785 Hyperlipidemia, unspecified: Secondary | ICD-10-CM | POA: Diagnosis not present

## 2024-01-25 DIAGNOSIS — M79671 Pain in right foot: Secondary | ICD-10-CM | POA: Diagnosis not present

## 2024-01-25 DIAGNOSIS — I10 Essential (primary) hypertension: Secondary | ICD-10-CM | POA: Diagnosis not present

## 2024-01-25 DIAGNOSIS — G4700 Insomnia, unspecified: Secondary | ICD-10-CM | POA: Diagnosis not present

## 2024-01-25 DIAGNOSIS — I4892 Unspecified atrial flutter: Secondary | ICD-10-CM | POA: Diagnosis not present

## 2024-01-25 DIAGNOSIS — I110 Hypertensive heart disease with heart failure: Secondary | ICD-10-CM | POA: Diagnosis not present

## 2024-04-23 ENCOUNTER — Other Ambulatory Visit: Payer: Self-pay | Admitting: Cardiology

## 2024-06-14 DIAGNOSIS — I10 Essential (primary) hypertension: Secondary | ICD-10-CM | POA: Diagnosis not present

## 2024-06-20 ENCOUNTER — Encounter: Payer: Self-pay | Admitting: Internal Medicine

## 2024-06-20 DIAGNOSIS — I4892 Unspecified atrial flutter: Secondary | ICD-10-CM | POA: Diagnosis not present

## 2024-06-20 DIAGNOSIS — Z0000 Encounter for general adult medical examination without abnormal findings: Secondary | ICD-10-CM | POA: Diagnosis not present

## 2024-06-20 DIAGNOSIS — I110 Hypertensive heart disease with heart failure: Secondary | ICD-10-CM | POA: Diagnosis not present

## 2024-06-20 DIAGNOSIS — I5022 Chronic systolic (congestive) heart failure: Secondary | ICD-10-CM | POA: Diagnosis not present

## 2024-06-20 DIAGNOSIS — I10 Essential (primary) hypertension: Secondary | ICD-10-CM | POA: Diagnosis not present

## 2024-06-20 DIAGNOSIS — E785 Hyperlipidemia, unspecified: Secondary | ICD-10-CM | POA: Diagnosis not present

## 2024-06-20 DIAGNOSIS — Z23 Encounter for immunization: Secondary | ICD-10-CM | POA: Diagnosis not present

## 2024-07-24 ENCOUNTER — Ambulatory Visit: Admitting: Cardiology

## 2024-07-25 ENCOUNTER — Telehealth: Payer: Self-pay | Admitting: Cardiology

## 2024-07-26 NOTE — Telephone Encounter (Signed)
" °*  STAT* If patient is at the pharmacy, call can be transferred to refill team.   1. Which medications need to be refilled? (please list name of each medication and dose if known)   ENTRESTO  24-26 MG   2. Would you like to learn more about the convenience, safety, & potential cost savings by using the [facility]?   3. Are you open to using the [facility] (Type [facility]. ).  4. Which pharmacy/location (including street and city if local pharmacy) is medication to be sent to?  [id number] [place], [place] - [address] AT [address]   5. Do they need a 30 day or 90 day supply?   90 day  Patient stated he still has some medication.  Patient stated he will need the brand name version of this medication.  Patient has appointment scheduled with Dr. Alvan on 4/22.   "

## 2024-07-27 MED ORDER — SACUBITRIL-VALSARTAN 24-26 MG PO TABS: 1.0000 | ORAL_TABLET | Freq: Two times a day (BID) | ORAL | 3 refills | Status: AC

## 2024-07-27 NOTE — Addendum Note (Signed)
 Addended by: KENETH KNEE C on: 07/27/2024 11:37 AM   Modules accepted: Orders

## 2024-07-27 NOTE — Telephone Encounter (Signed)
 Refill sent

## 2024-10-25 ENCOUNTER — Ambulatory Visit: Admitting: Cardiology
